# Patient Record
Sex: Female | Born: 1940 | Race: White | Hispanic: No | Smoking: Former smoker
Health system: Southern US, Community
[De-identification: ages and names within clinical notes are randomized; demographics above are authoritative.]

## PROBLEM LIST (undated history)

## (undated) DIAGNOSIS — E669 Obesity, unspecified: Secondary | ICD-10-CM

## (undated) DIAGNOSIS — E785 Hyperlipidemia, unspecified: Secondary | ICD-10-CM

## (undated) DIAGNOSIS — E119 Type 2 diabetes mellitus without complications: Secondary | ICD-10-CM

## (undated) DIAGNOSIS — M1990 Unspecified osteoarthritis, unspecified site: Secondary | ICD-10-CM

## (undated) DIAGNOSIS — G4730 Sleep apnea, unspecified: Secondary | ICD-10-CM

## (undated) HISTORY — PX: TONSILLECTOMY: SUR1361

## (undated) HISTORY — PX: ABDOMINAL HYSTERECTOMY: SHX81

## (undated) HISTORY — DX: Unspecified osteoarthritis, unspecified site: M19.90

## (undated) HISTORY — DX: Hyperlipidemia, unspecified: E78.5

## (undated) HISTORY — DX: Sleep apnea, unspecified: G47.30

## (undated) HISTORY — DX: Obesity, unspecified: E66.9

---

## 2000-08-11 ENCOUNTER — Encounter: Payer: Self-pay | Admitting: Family Medicine

## 2000-08-11 ENCOUNTER — Encounter: Admission: RE | Admit: 2000-08-11 | Discharge: 2000-08-11 | Payer: Self-pay | Admitting: Family Medicine

## 2001-11-21 ENCOUNTER — Encounter: Payer: Self-pay | Admitting: Family Medicine

## 2001-11-21 ENCOUNTER — Encounter: Admission: RE | Admit: 2001-11-21 | Discharge: 2001-11-21 | Payer: Self-pay | Admitting: Family Medicine

## 2004-02-26 ENCOUNTER — Encounter: Admission: RE | Admit: 2004-02-26 | Discharge: 2004-02-26 | Payer: Self-pay | Admitting: Family Medicine

## 2004-03-21 ENCOUNTER — Other Ambulatory Visit: Admission: RE | Admit: 2004-03-21 | Discharge: 2004-03-21 | Payer: Self-pay | Admitting: Family Medicine

## 2004-06-12 ENCOUNTER — Ambulatory Visit (HOSPITAL_COMMUNITY): Admission: RE | Admit: 2004-06-12 | Discharge: 2004-06-12 | Payer: Self-pay | Admitting: Gastroenterology

## 2004-09-30 ENCOUNTER — Encounter: Admission: RE | Admit: 2004-09-30 | Discharge: 2004-09-30 | Payer: Self-pay | Admitting: Family Medicine

## 2006-01-04 ENCOUNTER — Encounter: Admission: RE | Admit: 2006-01-04 | Discharge: 2006-01-04 | Payer: Self-pay | Admitting: Family Medicine

## 2006-03-02 ENCOUNTER — Other Ambulatory Visit: Admission: RE | Admit: 2006-03-02 | Discharge: 2006-03-02 | Payer: Self-pay | Admitting: Family Medicine

## 2006-03-03 ENCOUNTER — Encounter: Admission: RE | Admit: 2006-03-03 | Discharge: 2006-03-03 | Payer: Self-pay | Admitting: Family Medicine

## 2007-03-28 ENCOUNTER — Encounter: Admission: RE | Admit: 2007-03-28 | Discharge: 2007-03-28 | Payer: Self-pay | Admitting: Family Medicine

## 2007-04-05 ENCOUNTER — Other Ambulatory Visit: Admission: RE | Admit: 2007-04-05 | Discharge: 2007-04-05 | Payer: Self-pay | Admitting: Family Medicine

## 2008-05-15 ENCOUNTER — Encounter: Admission: RE | Admit: 2008-05-15 | Discharge: 2008-05-15 | Payer: Self-pay | Admitting: Family Medicine

## 2009-05-09 ENCOUNTER — Encounter: Admission: RE | Admit: 2009-05-09 | Discharge: 2009-05-09 | Payer: Self-pay | Admitting: Family Medicine

## 2009-06-06 ENCOUNTER — Encounter: Admission: RE | Admit: 2009-06-06 | Discharge: 2009-06-06 | Payer: Self-pay | Admitting: Family Medicine

## 2010-06-09 ENCOUNTER — Encounter
Admission: RE | Admit: 2010-06-09 | Discharge: 2010-06-09 | Payer: Self-pay | Source: Home / Self Care | Attending: Family Medicine | Admitting: Family Medicine

## 2010-08-05 ENCOUNTER — Inpatient Hospital Stay (HOSPITAL_COMMUNITY)
Admission: EM | Admit: 2010-08-05 | Discharge: 2010-08-07 | DRG: 392 | Disposition: A | Discharge status: Home / Self Care | Payer: Medicare Other | Attending: Internal Medicine | Admitting: Internal Medicine

## 2010-08-05 ENCOUNTER — Emergency Department (HOSPITAL_COMMUNITY): Payer: Medicare Other

## 2010-08-05 ENCOUNTER — Encounter (HOSPITAL_COMMUNITY): Payer: Self-pay

## 2010-08-05 DIAGNOSIS — E785 Hyperlipidemia, unspecified: Secondary | ICD-10-CM | POA: Diagnosis present

## 2010-08-05 DIAGNOSIS — K7689 Other specified diseases of liver: Secondary | ICD-10-CM | POA: Diagnosis present

## 2010-08-05 DIAGNOSIS — K5900 Constipation, unspecified: Secondary | ICD-10-CM | POA: Diagnosis present

## 2010-08-05 DIAGNOSIS — K5732 Diverticulitis of large intestine without perforation or abscess without bleeding: Principal | ICD-10-CM | POA: Diagnosis present

## 2010-08-05 LAB — CBC
MCV: 85.5 fL (ref 78.0–100.0)
Platelets: 293 10*3/uL (ref 150–400)

## 2010-08-05 LAB — URINALYSIS, ROUTINE W REFLEX MICROSCOPIC
Bilirubin Urine: NEGATIVE
Hgb urine dipstick: NEGATIVE
Ketones, ur: NEGATIVE mg/dL
Nitrite: NEGATIVE
Protein, ur: NEGATIVE mg/dL
Urine Glucose, Fasting: NEGATIVE mg/dL
Urobilinogen, UA: 1 mg/dL (ref 0.0–1.0)
pH: 7 (ref 5.0–8.0)

## 2010-08-05 LAB — POCT I-STAT, CHEM 8
Chloride: 105 mEq/L (ref 96–112)
Glucose, Bld: 150 mg/dL — ABNORMAL HIGH (ref 70–99)
Potassium: 4 mEq/L (ref 3.5–5.1)
Sodium: 136 mEq/L (ref 135–145)
TCO2: 22 mmol/L (ref 0–100)

## 2010-08-05 LAB — URINE MICROSCOPIC-ADD ON

## 2010-08-05 LAB — OCCULT BLOOD, POC DEVICE: Fecal Occult Bld: NEGATIVE

## 2010-08-05 MED ORDER — IOHEXOL 300 MG/ML  SOLN
80.0000 mL | Freq: Once | INTRAMUSCULAR | Status: AC | PRN
  Administered 2010-08-05: 80 mL via INTRAVENOUS

## 2010-08-07 LAB — CBC
Hemoglobin: 13 g/dL (ref 12.0–15.0)
MCHC: 34.1 g/dL (ref 30.0–36.0)
RBC: 4.4 MIL/uL (ref 3.87–5.11)
RDW: 12.4 % (ref 11.5–15.5)

## 2010-08-07 LAB — DIFFERENTIAL
Basophils Absolute: 0 10*3/uL (ref 0.0–0.1)
Basophils Relative: 1 % (ref 0–1)
Eosinophils Absolute: 0.1 10*3/uL (ref 0.0–0.7)
Neutro Abs: 2.9 10*3/uL (ref 1.7–7.7)
Neutrophils Relative %: 61 % (ref 43–77)

## 2010-08-11 NOTE — Discharge Summary (Signed)
Brianna [name], Brianna [name]             ACCOUNT NO.:  [id number]  MEDICAL RECORD NO.:  [id number]           PATIENT TYPE:  I  LOCATION:  5529                         FACILITY:  [facility]  PHYSICIAN:  Jeoffrey Massed, MD    DATE OF BIRTH:  05-Feb-1941  DATE OF ADMISSION:  08/05/2010 DATE OF DISCHARGE:                        DISCHARGE SUMMARY - REFERRING   PRIMARY CARE PRACTITIONER:  [name], M.D.  PRIMARY GASTROENTEROLOGIST:  Dr. Randa Evens of [facility].  PRIMARY DISCHARGE DIAGNOSIS:  Acute diverticulitis.  SECONDARY DISCHARGE DIAGNOSES: 1. Dyslipidemia. 2. Intermittent constipation.  DISCHARGE MEDICATIONS: 1. Tylenol 325 mg 2  tablets p.o. q.4 p.r.n. 2. Ciprofloxacin 500 mg 1 tablet p.o. twice daily for 7 more days. 3. Vicodin 5/325 one to two tablets every 6 hours p.r.n. 4. Flagyl 500 mg 1 tablet p.o. q.8 hours. 5. Zofran 4 mg 1 tablet p.o. q.6 h p.r.n. 6. Simvastatin 80 mg 1 tablet p.o. daily.  CONSULTATIONS:  None.  BRIEF HISTORY OF PRESENT ILLNESS:  The patient is a very delightful 70- year-old female with a past medical history of dyslipidemia was brought into the hospital on the August 05, 2010 for a lower abdominal pain, which was going on and off for the past 1 to 1-1/2 weeks.  However on the day of admission, the pain became severe with some nausea and was then brought into the ED for further evaluation.  A CT scan of the abdomen done showed acute diverticulitis involving the sigmoid colon and the patient was then admitted to the hospital for further evaluation and treatment.  For further details please see the history and physical that was dictated by Dr. Butler Denmark on admission.  PERTINENT LABORATORY STUDIES:  CBC on discharge, WBC of 4.8, hemoglobin of 13.0, and a platelet count of 329.  PERTINENT RADIOLOGICAL STUDIES:  CT scan of the abdomen and pelvis shows evidence of diverticulitis involving the sigmoid colon.  No focal abscess or free air is  identified.  BRIEF HOSPITAL COURSE:  Acute diverticulitis.  This is the first episode for this patient.  As noted, she was brought in for abdominal pain and a CT scan did confirm sigmoid diverticulitis.  The patient was initially kept n.p.o. and then started on a full liquid diet.  She was also placed on ciprofloxacin and Flagyl IV.  Over the past 48 hours, she has done very well.  She has tolerated a full liquid diet the whole day yesterday and then was transitioned to a soft/low residual diet today.  She is tolerating this as well.  She does get intermittent pain, but that is relieved by Vicodin.  Her last dose of Vicodin today was earlier this morning and for the rest of the day, she has had no further requirements for Vicodin since she has had no pain.  She did tolerate her lunch as well and is now requesting to be discharged home.  I have suggested that she follow up with her primary care practitioner, Dr. Clelia Croft in the next 5- 7 days and she consult her primary gastroenterologist in the next 2 to 3 week's time for further ongoing care of this first episode of diverticulitis.  She is to continue taking Flagyl and ciprofloxacin for another week.  She has also been provided with Zofran on a p.r.n. basis and also with Vicodin to use on a p.r.n. basis for the next few days. On examination, she has very minimal tenderness in her lower abdominal area with no rebound or guarding at all.  Rest of the abdomen was very soft and nontender.  CBC done today shows a white count to be 4.8 of a hemoglobin of 13.0 and a platelet count of 329.  At this point, she is considered stable to be discharged home.  DISPOSITION:  The patient was considered stable to be discharged home. She is currently pain free and tolerating a low residual diet.  FOLLOWUP INSTRUCTIONS: 1. She is to follow up with Dr. [name], her primary care     practitioner within 5-7 days.  She is to call and make an      appointment. 2. She is to follow up with Dr. Randa Evens of [facility] in the 1 to 2     weeks for further ongoing care of her diverticulitis and for     further workup including a colonoscopy as well in time. 3. She is to call and make both of these appointment.  She claims     understanding.  Total time spent equals 45 minutes.     Jeoffrey Massed, MD     SG/MEDQ  D:  08/07/2010  T:  08/07/2010  Job:  [id number]  cc:   [name], M.D. James L. Malon Kindle., M.D.  Electronically Signed by Jeoffrey Massed  on 08/11/2010 04:30:09 PM

## 2010-08-19 NOTE — H&P (Signed)
NAME:  Brianna [name], Brianna [name]             ACCOUNT NO.:  [id number]  MEDICAL RECORD NO.:  [id number]           PATIENT TYPE:  E  LOCATION:  [facility]                         FACILITY:  [facility]  PHYSICIAN:  Calvert Cantor, M.D.     DATE OF BIRTH:  February 02, 1941  DATE OF ADMISSION:  08/05/2010 DATE OF DISCHARGE:                             HISTORY & PHYSICAL   PRIMARY CARE PHYSICIAN:  [name], MD.  PRESENTING COMPLAINT:  Lower abdominal pain.  HISTORY OF PRESENT ILLNESS:  This is a 70 year old female with a history of hyperlipidemia, who comes in with a complaint of pain across her lower abdomen going on for about 1-1/2 weeks now.  She is not aware of any aggravating or relieving factors.  She has been eating like normal. Has not had any nausea, vomiting, or diarrhea.  She describes the pain as dull and intermittently cramping.  She is found to have diverticulitis of her sigmoid colon and is being admitted.  She states that pain was 10/10 this morning and therefore she decided to come to the ER.  PAST MEDICAL HISTORY:  Hyperlipidemia.  SOCIAL HISTORY:  Does not smoke, drink, or use drugs.  She is retired. She lives with her husband.  ALLERGIES:  No known drug allergies.  HOME MEDICATIONS:  Simvastatin 80 mg at bedtime.  REVIEW OF SYSTEMS:  No recent weight loss or weight gain.  No fever, chills, or sweats.  She has had some fatigue.  No nausea, vomiting, or diarrhea.  All the review of systems are negative.  PHYSICAL EXAM:  VITAL SIGNS:  Blood pressure 110/58, pulse 77, respiratory rate 20, temperature 98.7, pulse ox 97% on room air. HEENT:  Pupils equal, round, and reactive to light.  Extraocular movements are intact.  Conjunctiva is pink.  No scleral icterus.  Oral mucosa is moist.  Oropharynx clear. NECK:  Supple.  No thyromegaly, lymphadenopathy, or carotid bruits. HEART:  Regular rate and rhythm.  No murmurs, rubs, or gallops. LUNGS:  Clear bilaterally.  Normal respiratory  effort.  No use of accessory muscles. ABDOMEN:  Soft.  Tender in the left lower quadrant suprapubic area. Less tender in the right lower quadrant.  Bowel sounds are positive, nondistended. EXTREMITIES:  No cyanosis, clubbing, or edema.  Pedal pulses positive. NEUROLOGIC:  Cranial nerves II-XII intact.  Strength intact in all 4 extremities. PSYCHOLOGIC:  Awake, alert, oriented x3.  Mood and affect normal. SKIN:  Warm and dry.  No rash or bruising.  LABORATORY DATA:  Blood work, basic metabolic panel and CBC are within normal limits.  UA reveals 0-2 wbc's, few bacteria, and a small amount of leukocyte esterase.  Specific gravity 1.021.  IMAGING:  CT scan of the abdomen reveals evidence of diverticulitis involving the sigmoid colon.  The patient also has a fatty liver.  Acute abdominal series with chest x-ray reveals minimal chronic bronchitic changes and mildly prominent stool in the abdomen.  She has minimal scoliosis as well.  ASSESSMENT AND PLAN: 1. Diverticulitis, mainly being admitted as her pain is uncontrolled,  otherwise she could take p.o. antibiotics.  We will start her on IV  Cipro and Flagyl and hopefully she will be able to be discharged     tomorrow if her pain is better controlled.  For now, I will put her     on full liquids, which should hopefully help decrease inflammation     in her colon.  Diet can be advanced tomorrow. 2. Fatty liver as noted on ultrasound. 3. Constipation. 4. Hyperlipidemia.  Time on admission 50 minutes.     Calvert Cantor, M.D.     SR/MEDQ  D:  08/05/2010  T:  08/05/2010  Job:  [id number]  cc:   [name], M.D.  Electronically Signed by Calvert Cantor M.D. on 08/19/2010 08:20:58 PM

## 2010-11-07 NOTE — Op Note (Signed)
Brianna [name], Brianna [name]             ACCOUNT NO.:  [id number]   MEDICAL RECORD NO.:  [id number]          PATIENT TYPE:  AMB   LOCATION:  ENDO                         FACILITY:  [facility]   PHYSICIAN:  James L. Malon Kindle., M.D.DATE OF BIRTH:  14-Feb-1941   DATE OF PROCEDURE:  06/12/2004  DATE OF DISCHARGE:                                 OPERATIVE REPORT   PROCEDURE PERFORMED:  Colonoscopy.   ENDOSCOPIST:  Llana Aliment. Edwards, M.D.   MEDICATIONS:  Fentanyl 100 mcg, Versed 10 mg IV.   INDICATIONS FOR PROCEDURE:  Colon cancer screening.   DESCRIPTION OF PROCEDURE:  The procedure had been explained to the patient  and consent obtained.  With the patient in the left lateral decubitus  position, the pediatric adjustable Olympus colonoscope was inserted and  advanced.  The prep was excellent and we were able to advance to the cecum  without difficulty.  The ileocecal valve and appendiceal orifice were seen.  The scope was withdrawn and the cecum, ascending colon, transverse colon,  descending and sigmoid colon were seen well.  No polyps or other lesions  seen.  The rectum was free of polyps.  The scope was withdrawn.  The patient  tolerated the procedure well.   ASSESSMENT:  Normal screening colonoscopy, [id number].   PLAN:  Will recommend yearly Hemoccults, possibly repeat procedure in 10  years.       JLE/MEDQ  D:  06/12/2004  T:  06/13/2004  Job:  [id number]   cc:   Gretta Arab. Valentina Lucks, M.D.  [address] [place]  [place] [address]  Fax: [phone]

## 2011-05-06 ENCOUNTER — Other Ambulatory Visit: Payer: Self-pay | Admitting: Family Medicine

## 2011-05-06 DIAGNOSIS — Z1231 Encounter for screening mammogram for malignant neoplasm of breast: Secondary | ICD-10-CM

## 2011-06-11 ENCOUNTER — Ambulatory Visit
Admission: RE | Admit: 2011-06-11 | Discharge: 2011-06-11 | Disposition: A | Discharge status: Home / Self Care | Payer: Medicare Other | Source: Ambulatory Visit | Attending: Family Medicine | Admitting: Family Medicine

## 2011-06-11 DIAGNOSIS — Z1231 Encounter for screening mammogram for malignant neoplasm of breast: Secondary | ICD-10-CM

## 2012-05-10 ENCOUNTER — Other Ambulatory Visit: Payer: Self-pay | Admitting: Family Medicine

## 2012-05-10 DIAGNOSIS — Z1231 Encounter for screening mammogram for malignant neoplasm of breast: Secondary | ICD-10-CM

## 2012-06-17 ENCOUNTER — Ambulatory Visit: Payer: Medicare Other

## 2012-06-20 ENCOUNTER — Ambulatory Visit
Admission: RE | Admit: 2012-06-20 | Discharge: 2012-06-20 | Disposition: A | Discharge status: Home / Self Care | Payer: Medicare Other | Source: Ambulatory Visit | Attending: Family Medicine | Admitting: Family Medicine

## 2012-06-20 DIAGNOSIS — Z1231 Encounter for screening mammogram for malignant neoplasm of breast: Secondary | ICD-10-CM

## 2013-01-05 ENCOUNTER — Encounter: Payer: Self-pay | Admitting: Dietician

## 2013-01-05 ENCOUNTER — Encounter: Payer: Medicare Other | Attending: Family Medicine | Admitting: Dietician

## 2013-01-05 VITALS — Ht 63.0 in | Wt 146.4 lb

## 2013-01-05 DIAGNOSIS — E119 Type 2 diabetes mellitus without complications: Secondary | ICD-10-CM | POA: Insufficient documentation

## 2013-01-05 DIAGNOSIS — Z713 Dietary counseling and surveillance: Secondary | ICD-10-CM | POA: Insufficient documentation

## 2013-01-05 NOTE — Addendum Note (Signed)
Addended by: Delmer Islam on: 01/05/2013 02:05 PM   Modules accepted: Orders

## 2013-01-05 NOTE — Progress Notes (Signed)
  Patient was seen on 07/17/2014for the first of a series of three diabetes self-management courses at the [facility]. The following learning objectives were met by the patient during this course:   Defines the role of glucose and insulin  Identifies type of diabetes and pathophysiology  Defines the diagnostic criteria for diabetes and prediabetes  States the risk factors for Type 2 Diabetes  States the symptoms of Type 2 Diabetes  Defines Type 2 Diabetes treatment goals  Defines Type 2 Diabetes treatment options  States the rationale for glucose monitoring  Identifies A1C, glucose targets, and testing times  Identifies proper sharps disposal  Defines the purpose of a diabetes food plan  Identifies carbohydrate food groups  Defines effects of carbohydrate foods on glucose levels  Identifies carbohydrate choices/grams/food labels  States benefits of physical activity and effect on glucose  Review of suggested activity guidelines  Handouts given during class include:  Type 2 Diabetes: Basics Book  My Food Plan Book  Food and Activity Log  Follow-Up Plan: Attend Core 2  

## 2013-01-24 ENCOUNTER — Encounter: Payer: Medicare Other | Attending: Family Medicine

## 2013-01-24 DIAGNOSIS — E119 Type 2 diabetes mellitus without complications: Secondary | ICD-10-CM | POA: Insufficient documentation

## 2013-01-24 DIAGNOSIS — Z713 Dietary counseling and surveillance: Secondary | ICD-10-CM | POA: Insufficient documentation

## 2013-01-25 NOTE — Progress Notes (Signed)
Patient was seen on 01/24/13 for the second of a series of three diabetes self-management courses at the [facility]. The following learning objectives were met by the patient during this course:   Explain basic nutrition maintenance and quality assurance  Describe causes, symptoms and treatment of hypoglycemia and hyperglycemia  Explain how to manage diabetes during illness  Describe the importance of good nutrition for health and healthy eating strategies  List strategies to follow meal plan when dining out  Describe the effects of alcohol on glucose and how to use it safely  Describe problem solving skills for day-to-day glucose challenges  Describe strategies to use when treatment plan needs to change  Identify important factors involved in successful weight loss  Describe ways to remain physically active  Describe the impact of regular activity on insulin resistance  Identify current diabetes medications, their action on blood glucose, and [pssible side effects.  Handouts given in class:  Refrigerator magnet for Sick Day Guidelines  [facility] Oral medication/insulin handout  Your patient has identified their diabetes self-care support plan as:  [facility] support group   Follow-Up Plan: Patient will attend the final class of the ADA Diabetes Self-Care Education.   

## 2013-02-23 ENCOUNTER — Encounter: Payer: Medicare Other | Attending: Family Medicine

## 2013-02-23 DIAGNOSIS — Z713 Dietary counseling and surveillance: Secondary | ICD-10-CM | POA: Insufficient documentation

## 2013-02-23 DIAGNOSIS — E119 Type 2 diabetes mellitus without complications: Secondary | ICD-10-CM

## 2013-02-23 NOTE — Progress Notes (Signed)
Patient was seen on 02/23/13 for the third of a series of three diabetes self-management courses at the [facility]. The following learning objectives were met by the patient during this course:    Describe how diabetes changes over time   Identify diabetes complications and ways to prevent them   Describe strategies that can promote heart health including lowering blood pressure and cholesterol   Describe strategies to lower dietary fat and sodium in the diet   Identify physical activities that benefit cardiovascular health   Describe role of stress on blood glucose and develop strategies to address psychosocial issues   Evaluate success in meeting personal goal   Describe the belief that they can live successfully with diabetes day to day   Establish 2-3 goals that they will plan to diligently work on until they return for the free 14-month follow-up visit  The following handouts were given in class:  Goal setting handout  Class evaluation form  Low-sodium seasoning tips  Stress management handout  Your patient has established the following 4 month goals for diabetes self-care:  Eat more vegetables  Be active 30 minutes 3 days a week  Your patient has identified these potential barriers to change:  spouse  Your patient has identified their diabetes self-care support plan as:  [facility] support group  spouse   Follow-Up Plan: Patient was offered a 4 month follow-up visit for diabetes self-management education.

## 2013-05-24 ENCOUNTER — Other Ambulatory Visit: Payer: Self-pay

## 2013-05-24 DIAGNOSIS — Z1231 Encounter for screening mammogram for malignant neoplasm of breast: Secondary | ICD-10-CM

## 2013-05-29 ENCOUNTER — Ambulatory Visit
Admission: RE | Admit: 2013-05-29 | Discharge: 2013-05-29 | Disposition: A | Discharge status: Home / Self Care | Payer: Medicare Other | Source: Ambulatory Visit | Attending: Family Medicine | Admitting: Family Medicine

## 2013-05-29 ENCOUNTER — Other Ambulatory Visit: Payer: Self-pay | Admitting: Family Medicine

## 2013-05-29 DIAGNOSIS — M542 Cervicalgia: Secondary | ICD-10-CM

## 2013-06-08 ENCOUNTER — Other Ambulatory Visit: Payer: Self-pay | Admitting: Family Medicine

## 2013-06-08 DIAGNOSIS — M542 Cervicalgia: Secondary | ICD-10-CM

## 2013-06-13 ENCOUNTER — Ambulatory Visit
Admission: RE | Admit: 2013-06-13 | Discharge: 2013-06-13 | Disposition: A | Discharge status: Home / Self Care | Payer: Medicare Other | Source: Ambulatory Visit | Attending: Family Medicine | Admitting: Family Medicine

## 2013-06-13 DIAGNOSIS — M542 Cervicalgia: Secondary | ICD-10-CM

## 2013-06-27 ENCOUNTER — Encounter: Payer: Self-pay | Admitting: *Deleted

## 2013-06-27 ENCOUNTER — Encounter: Payer: Medicare Other | Attending: Family Medicine | Admitting: *Deleted

## 2013-06-27 VITALS — Ht 63.0 in | Wt 137.7 lb

## 2013-06-27 DIAGNOSIS — E119 Type 2 diabetes mellitus without complications: Secondary | ICD-10-CM

## 2013-06-27 DIAGNOSIS — Z713 Dietary counseling and surveillance: Secondary | ICD-10-CM | POA: Insufficient documentation

## 2013-06-27 NOTE — Patient Instructions (Signed)
PLAN: Find exercise alternative for inclement weather Have 1 carb choice/protein snack in evening Test glucose FBS and 2hours after meal alternating days Anticipate elevation of glucose reading with the steroid injections in the neck.

## 2013-06-27 NOTE — Progress Notes (Signed)
  Patient was seen on 06/27/13 for their 3 month follow-up as a part of the diabetes self-management courses at the [facility].   Patient self reports the following: Weight loss 14# since diagnosis of T2DM 12/2012. FBS range 130-135 mg/dl, pp range 116-120. Last PM 2072hpp. Last PM reading was alarming to her. She is experiencing some sever pain in her neck due to arthritis. She will be receiving steroid injections in the spine in the near future.  Diabetes control has improved since diabetes self-management training: does well regularly, had a struggle during the holidays with portion control. Number of days blood glucose is >200: none Last MD appointment for diabetes: 04/2013 with current A1c 6.2% which is down from 7.0% Changes in treatment plan: Find an exercise program that you can do inside during inclement weather. Walking CD's, low impact exercise CD's. Consider having a 1 carb choice snack/protein prior to bed to see if FBS comes down due to [place]. Confidence with ability to manage diabetes: high Areas for improvement with diabetes self-care: self control during group events. During holidays just ate too much of the "good" food. Not too much with the sweets. Willingness to participate in diabetes support group: not at this time  PLAN: Find exercise alternative for inclement weather Have 1 carb choice/protein snack in evening Test glucose FBS and 2hours after meal alternating days Anticipate elevation of glucose reading with the steroid injections in the neck.  Follow-Up Plan: Patient to call and schedule as needed.

## 2013-06-28 ENCOUNTER — Ambulatory Visit
Admission: RE | Admit: 2013-06-28 | Discharge: 2013-06-28 | Disposition: A | Discharge status: Home / Self Care | Payer: Medicare Other | Source: Ambulatory Visit

## 2013-06-28 DIAGNOSIS — Z1231 Encounter for screening mammogram for malignant neoplasm of breast: Secondary | ICD-10-CM

## 2013-08-03 ENCOUNTER — Emergency Department (HOSPITAL_COMMUNITY): Payer: Medicare Other

## 2013-08-03 ENCOUNTER — Encounter (HOSPITAL_COMMUNITY): Payer: Self-pay | Admitting: Emergency Medicine

## 2013-08-03 ENCOUNTER — Emergency Department (HOSPITAL_COMMUNITY)
Admission: EM | Admit: 2013-08-03 | Discharge: 2013-08-03 | Disposition: A | Discharge status: Home / Self Care | Payer: Medicare Other | Attending: Emergency Medicine | Admitting: Emergency Medicine

## 2013-08-03 DIAGNOSIS — G4730 Sleep apnea, unspecified: Secondary | ICD-10-CM | POA: Insufficient documentation

## 2013-08-03 DIAGNOSIS — Z7982 Long term (current) use of aspirin: Secondary | ICD-10-CM | POA: Insufficient documentation

## 2013-08-03 DIAGNOSIS — E785 Hyperlipidemia, unspecified: Secondary | ICD-10-CM | POA: Insufficient documentation

## 2013-08-03 DIAGNOSIS — R1110 Vomiting, unspecified: Secondary | ICD-10-CM

## 2013-08-03 DIAGNOSIS — Z79899 Other long term (current) drug therapy: Secondary | ICD-10-CM | POA: Insufficient documentation

## 2013-08-03 DIAGNOSIS — R109 Unspecified abdominal pain: Secondary | ICD-10-CM

## 2013-08-03 DIAGNOSIS — R112 Nausea with vomiting, unspecified: Secondary | ICD-10-CM | POA: Insufficient documentation

## 2013-08-03 DIAGNOSIS — M47812 Spondylosis without myelopathy or radiculopathy, cervical region: Secondary | ICD-10-CM | POA: Insufficient documentation

## 2013-08-03 DIAGNOSIS — R1011 Right upper quadrant pain: Secondary | ICD-10-CM | POA: Insufficient documentation

## 2013-08-03 DIAGNOSIS — E669 Obesity, unspecified: Secondary | ICD-10-CM | POA: Insufficient documentation

## 2013-08-03 DIAGNOSIS — Z90710 Acquired absence of both cervix and uterus: Secondary | ICD-10-CM | POA: Insufficient documentation

## 2013-08-03 DIAGNOSIS — Z791 Long term (current) use of non-steroidal anti-inflammatories (NSAID): Secondary | ICD-10-CM | POA: Insufficient documentation

## 2013-08-03 LAB — CBC WITH DIFFERENTIAL/PLATELET
BASOS PCT: 1 % (ref 0–1)
Basophils Absolute: 0 10*3/uL (ref 0.0–0.1)
Eosinophils Absolute: 0.1 10*3/uL (ref 0.0–0.7)
Eosinophils Relative: 3 % (ref 0–5)
HCT: 38.3 % (ref 36.0–46.0)
HEMOGLOBIN: 13.3 g/dL (ref 12.0–15.0)
Lymphocytes Relative: 38 % (ref 12–46)
Lymphs Abs: 2 10*3/uL (ref 0.7–4.0)
MCH: 29.3 pg (ref 26.0–34.0)
MCHC: 34.7 g/dL (ref 30.0–36.0)
MCV: 84.4 fL (ref 78.0–100.0)
MONOS PCT: 7 % (ref 3–12)
Monocytes Absolute: 0.4 10*3/uL (ref 0.1–1.0)
NEUTROS ABS: 2.7 10*3/uL (ref 1.7–7.7)
NEUTROS PCT: 52 % (ref 43–77)
Platelets: 239 10*3/uL (ref 150–400)
RBC: 4.54 MIL/uL (ref 3.87–5.11)
RDW: 12.5 % (ref 11.5–15.5)
WBC: 5.3 10*3/uL (ref 4.0–10.5)

## 2013-08-03 LAB — URINALYSIS, ROUTINE W REFLEX MICROSCOPIC
Bilirubin Urine: NEGATIVE
Glucose, UA: NEGATIVE mg/dL
HGB URINE DIPSTICK: NEGATIVE
Ketones, ur: NEGATIVE mg/dL
NITRITE: NEGATIVE
PH: 8 (ref 5.0–8.0)
Protein, ur: NEGATIVE mg/dL
Specific Gravity, Urine: 1.02 (ref 1.005–1.030)
Urobilinogen, UA: 0.2 mg/dL (ref 0.0–1.0)

## 2013-08-03 LAB — COMPREHENSIVE METABOLIC PANEL
ALBUMIN: 4 g/dL (ref 3.5–5.2)
ALK PHOS: 57 U/L (ref 39–117)
ALT: 20 U/L (ref 0–35)
AST: 21 U/L (ref 0–37)
BUN: 20 mg/dL (ref 6–23)
CHLORIDE: 103 meq/L (ref 96–112)
CO2: 22 mEq/L (ref 19–32)
Calcium: 9.8 mg/dL (ref 8.4–10.5)
Creatinine, Ser: 0.76 mg/dL (ref 0.50–1.10)
GFR calc Af Amer: >90 mL/min (ref >90)
GFR calc non Af Amer: 82 mL/min — ABNORMAL LOW (ref >90)
Glucose, Bld: 115 mg/dL — ABNORMAL HIGH (ref 70–99)
Potassium: 4.3 mEq/L (ref 3.7–5.3)
SODIUM: 141 meq/L (ref 137–147)
Total Bilirubin: 0.6 mg/dL (ref 0.3–1.2)
Total Protein: 6.9 g/dL (ref 6.0–8.3)

## 2013-08-03 LAB — CG4 I-STAT (LACTIC ACID)
Lactic Acid, Venous: 2.75 mmol/L — ABNORMAL HIGH (ref 0.5–2.2)
Lactic Acid, Venous: 1.7 mmol/L (ref 0.5–2.2)

## 2013-08-03 LAB — URINE MICROSCOPIC-ADD ON

## 2013-08-03 LAB — LIPASE, BLOOD: LIPASE: 42 U/L (ref 11–59)

## 2013-08-03 MED ORDER — PROMETHAZINE HCL 12.5 MG PO TABS: 12.5000 mg | ORAL_TABLET | Freq: Four times a day (QID) | ORAL | Status: DC | PRN

## 2013-08-03 MED ORDER — IOHEXOL 300 MG/ML  SOLN
50.0000 mL | Freq: Once | INTRAMUSCULAR | Status: AC | PRN
  Administered 2013-08-03: 50 mL via ORAL

## 2013-08-03 MED ORDER — ONDANSETRON HCL 4 MG/2ML IJ SOLN
4.0000 mg | Freq: Once | INTRAMUSCULAR | Status: AC
  Administered 2013-08-03: 4 mg via INTRAVENOUS
  Filled 2013-08-03: qty 2

## 2013-08-03 MED ORDER — PROMETHAZINE HCL 25 MG/ML IJ SOLN
12.5000 mg | Freq: Once | INTRAMUSCULAR | Status: AC
  Administered 2013-08-03: 12.5 mg via INTRAMUSCULAR
  Filled 2013-08-03 (×2): qty 1

## 2013-08-03 MED ORDER — SODIUM CHLORIDE 0.9 % IV BOLUS (SEPSIS)
1000.0000 mL | Freq: Once | INTRAVENOUS | Status: AC
  Administered 2013-08-03: 1000 mL via INTRAVENOUS

## 2013-08-03 MED ORDER — OXYCODONE-ACETAMINOPHEN 5-325 MG PO TABS: 1.0000 | ORAL_TABLET | ORAL | Status: DC | PRN

## 2013-08-03 MED ORDER — HYDROMORPHONE HCL PF 1 MG/ML IJ SOLN
1.0000 mg | Freq: Once | INTRAMUSCULAR | Status: AC
  Administered 2013-08-03: 1 mg via INTRAVENOUS
  Filled 2013-08-03: qty 1

## 2013-08-03 MED ORDER — HYDROMORPHONE HCL PF 1 MG/ML IJ SOLN
0.5000 mg | Freq: Once | INTRAMUSCULAR | Status: AC
  Administered 2013-08-03: 0.5 mg via INTRAVENOUS
  Filled 2013-08-03: qty 1

## 2013-08-03 NOTE — ED Notes (Signed)
Pt sent from MD office; c/o right upper quad pain since Monday off and on; sharp in nature radiating to right flank area; given toradol 60 mg at MD office without relief

## 2013-08-03 NOTE — ED Notes (Signed)
Lab to be drawn after liter of fluid has infused.  Wynetta Emery RN will inform.

## 2013-08-03 NOTE — ED Provider Notes (Signed)
4:10 PM Care transferred to me. Patient with colicky RUQ pain, benign labs and negative CT and abd u/s. Mild lactate elevation. Given fluids, will recheck lactate and if down trending will d/c home.  5:16 PM Lactate down to 1.7. Patient vomiting despite being given zofran for nausea. Will try phenergan IM and try to fluid challenge. If not improved will likely need admission.  7:00 PM Patient a little nauseous but tolerating fluids. Still with colicky pain. At this time her w/u is negative, will d/c with pain meds, nausea meds. I discussed strict return precautions, and she will f/u closely with PCP.  Ephraim Hamburger, MD 08/03/13 [phone]

## 2013-08-03 NOTE — Discharge Instructions (Signed)
Abdominal Pain, Adult °Many things can cause abdominal pain. Usually, abdominal pain is not caused by a disease and will improve without treatment. It can often be observed and treated at home. Your health care provider will do a physical exam and possibly order blood tests and X-rays to help determine the seriousness of your pain. However, in many cases, more time must pass before a clear cause of the pain can be found. Before that point, your health care provider may not know if you need more testing or further treatment. °HOME CARE INSTRUCTIONS  °Monitor your abdominal pain for any changes. The following actions may help to alleviate any discomfort you are experiencing: °· Only take over-the-counter or prescription medicines as directed by your health care provider. °· Do not take laxatives unless directed to do so by your health care provider. °· Try a clear liquid diet (broth, tea, or water) as directed by your health care provider. Slowly move to a bland diet as tolerated. °SEEK MEDICAL CARE IF: °· You have unexplained abdominal pain. °· You have abdominal pain associated with nausea or diarrhea. °· You have pain when you urinate or have a bowel movement. °· You experience abdominal pain that wakes you in the night. °· You have abdominal pain that is worsened or improved by eating food. °· You have abdominal pain that is worsened with eating fatty foods. °SEEK IMMEDIATE MEDICAL CARE IF:  °· Your pain does not go away within 2 hours. °· You have a fever. °· You keep throwing up (vomiting). °· Your pain is felt only in portions of the abdomen, such as the right side or the left lower portion of the abdomen. °· You pass bloody or black tarry stools. °MAKE SURE YOU: °· Understand these instructions.   °· Will watch your condition.   °· Will get help right away if you are not doing well or get worse.   °Document Released: 03/18/2005 Document Revised: 03/29/2013 Document Reviewed: 02/15/2013 °[employer]® Patient  Information ©2014 [employer]. ° °

## 2013-08-03 NOTE — ED Provider Notes (Signed)
CSN: [id number]     Arrival date & time 08/03/13  1113 History   First MD Initiated Contact with Patient 08/03/13 1141     Chief Complaint  Patient presents with  . Abdominal Pain     (Consider location/radiation/quality/duration/timing/severity/associated sxs/prior Treatment) Patient is a 73 y.o. female presenting with abdominal pain. The history is provided by the patient. No language interpreter was used.  Abdominal Pain Pain location:  RUQ Pain quality: sharp   Pain radiates to:  Back Pain severity:  Severe Onset quality:  Unable to specify Duration:  3 days Timing:  Intermittent Progression:  Waxing and waning Chronicity:  New Relieved by:  Nothing Worsened by:  Nothing tried Ineffective treatments:  None tried Associated symptoms: nausea   Associated symptoms: no chest pain, no chills, no constipation, no cough, no diarrhea, no dysuria, no fatigue, no fever, no hematuria, no shortness of breath, no sore throat and no vomiting   Risk factors: being elderly, multiple surgeries and NSAID use   Risk factors: no alcohol abuse, no aspirin use and no recent hospitalization     Past Medical History  Diagnosis Date  . Sleep apnea   . Obesity   . Hyperlipidemia   . Arthritis     neck   Past Surgical History  Procedure Laterality Date  . Tonsillectomy    . Abdominal hysterectomy     Family History  Problem Relation Age of Onset  . Cancer Other    History  Substance Use Topics  . Smoking status: Never Smoker   . Smokeless tobacco: Not on file  . Alcohol Use: Not on file   OB History   Grav Para Term Preterm Abortions TAB SAB Ect Mult Living                 Review of Systems  Constitutional: Negative for fever, chills, diaphoresis, activity change, appetite change and fatigue.  HENT: Negative for congestion, facial swelling, rhinorrhea and sore throat.   Eyes: Negative for photophobia and discharge.  Respiratory: Negative for cough, chest tightness and shortness  of breath.   Cardiovascular: Negative for chest pain, palpitations and leg swelling.  Gastrointestinal: Positive for nausea and abdominal pain. Negative for vomiting, diarrhea and constipation.  Endocrine: Negative for polydipsia and polyuria.  Genitourinary: Negative for dysuria, frequency, hematuria, difficulty urinating and pelvic pain.  Musculoskeletal: Negative for arthralgias, back pain, neck pain and neck stiffness.  Skin: Negative for color change and wound.  Allergic/Immunologic: Negative for immunocompromised state.  Neurological: Negative for facial asymmetry, weakness, numbness and headaches.  Hematological: Does not bruise/bleed easily.  Psychiatric/Behavioral: Negative for confusion and agitation.      Allergies  Review of patient's allergies indicates no known allergies.  Home Medications   Current Outpatient Rx  Name  Route  Sig  Dispense  Refill  . aspirin 81 MG tablet   Oral   Take 81 mg by mouth daily.         Marland Kitchen atorvastatin (LIPITOR) 20 MG tablet   Oral   Take 20 mg by mouth daily.         . calcium-vitamin D (OSCAL WITH D) 500-200 MG-UNIT per tablet   Oral   Take 1 tablet by mouth.         . cholecalciferol (VITAMIN D) 1000 UNITS tablet   Oral   Take 1,000 Units by mouth daily.          . Cinnamon 500 MG capsule   Oral   Take  1,000 mg by mouth daily.         Marland Kitchen FIBER PO   Oral   Take by mouth.         . fish oil-omega-3 fatty acids 1000 MG capsule   Oral   Take 1 g by mouth daily.          Marland Kitchen ibuprofen (ADVIL,MOTRIN) 200 MG tablet   Oral   Take 200-400 mg by mouth every 6 (six) hours as needed for headache.         . Multiple Vitamins-Minerals (HAIR/SKIN/NAILS PO)   Oral   Take 1 tablet by mouth daily.         . cyclobenzaprine (FLEXERIL) 5 MG tablet   Oral   Take 5 mg by mouth 3 (three) times daily as needed for muscle spasms.         . meloxicam (MOBIC) 7.5 MG tablet   Oral   Take 7.5 mg by mouth daily.          Marland Kitchen oxyCODONE-acetaminophen (PERCOCET) 5-325 MG per tablet   Oral   Take 1 tablet by mouth every 4 (four) hours as needed.   15 tablet   0   . promethazine (PHENERGAN) 12.5 MG tablet   Oral   Take 1 tablet (12.5 mg total) by mouth every 6 (six) hours as needed for nausea or vomiting.   10 tablet   0    BP 137/51  Pulse 61  Temp(Src) 97.5 F (36.4 C) (Oral)  Resp 14  Wt 135 lb (61.236 kg)  SpO2 97% Physical Exam  Constitutional: She is oriented to person, place, and time. She appears well-developed and well-nourished. No distress.  HENT:  Head: Normocephalic and atraumatic.  Mouth/Throat: No oropharyngeal exudate.  Eyes: Pupils are equal, round, and reactive to light.  Neck: Normal range of motion. Neck supple.  Cardiovascular: Normal rate, regular rhythm and normal heart sounds.  Exam reveals no gallop and no friction rub.   No murmur heard. Pulmonary/Chest: Effort normal and breath sounds normal. No respiratory distress. She has no wheezes. She has no rales.  Abdominal: Soft. Bowel sounds are normal. She exhibits no distension and no mass. There is tenderness in the right upper quadrant. There is positive Murphy's sign. There is no rebound and no guarding.  Musculoskeletal: Normal range of motion. She exhibits no edema and no tenderness.  Neurological: She is alert and oriented to person, place, and time.  Skin: Skin is warm and dry.  Psychiatric: She has a normal mood and affect.    ED Course  Procedures (including critical care time) Labs Review Labs Reviewed  COMPREHENSIVE METABOLIC PANEL - Abnormal; Notable for the following:    Glucose, Bld 115 (*)    GFR calc non Af Amer 82 (*)    All other components within normal limits  URINALYSIS, ROUTINE W REFLEX MICROSCOPIC - Abnormal; Notable for the following:    Leukocytes, UA TRACE (*)    All other components within normal limits  URINE MICROSCOPIC-ADD ON - Abnormal; Notable for the following:    Squamous Epithelial /  LPF FEW (*)    All other components within normal limits  CG4 I-STAT (LACTIC ACID) - Abnormal; Notable for the following:    Lactic Acid, Venous 2.75 (*)    All other components within normal limits  URINE CULTURE  CBC WITH DIFFERENTIAL  LIPASE, BLOOD  CG4 I-STAT (LACTIC ACID)   Imaging Review Ct Abdomen Pelvis Wo Contrast  08/03/2013  CLINICAL DATA:  Right upper quadrant pain sharp in nature radiating to right flank  EXAM: CT ABDOMEN AND PELVIS WITHOUT CONTRAST  TECHNIQUE: Multidetector CT imaging of the abdomen and pelvis was performed following the standard protocol without intravenous contrast.  COMPARISON:  08/05/2010  FINDINGS: Minimal atelectasis at lung bases.  Scattered atherosclerotic calcifications.  A single tiny calcification is seen at the right renal hilum, uncertain if within renal pelvis or vascular in origin.  No associated hydronephrosis.  No additional urinary tract calcification or ureteral dilatation.  Unremarkable bladder and ureters.  Uterus surgically absent with nonvisualization of the ovaries.  Within limits of a nonenhanced exam, no focal abnormalities of the liver, spleen, pancreas, kidneys, or adrenal glands.  Gallbladder unremarkable by CT.  Minimal sigmoid diverticulosis without evidence of diverticulitis.  Stomach and bowel loops otherwise unremarkable.  No mass, adenopathy, free fluid or inflammatory process.  No hernia or acute bone lesions.  IMPRESSION: Minimal sigmoid diverticulosis.  Tiny calcification at right renal hilum though uncertain if related to a vascular structure or a tiny renal pelvic calcification, favor vascular calcification.  No right hydronephrosis or ureteral dilatation identified.  Remainder of exam unremarkable.   Electronically Signed   By: Lavonia Dana M.D.   On: 08/03/2013 15:51   US Abdomen Complete  08/03/2013   CLINICAL DATA:  Right upper quadrant pain radiating to back  EXAM: ULTRASOUND ABDOMEN COMPLETE  COMPARISON:  Abdominal ultrasound  dated 01/11/2013  FINDINGS: Gallbladder:  Suspected 7 mm gallbladder polyp, nonmobile. No gallbladder wall thickening or pericholecystic fluid. Positive sonographic Murphy's sign.  Common bile duct:  Diameter: 3 mm.  Liver:  No focal lesion identified. Within normal limits in parenchymal echogenicity.  IVC:  No abnormality visualized.  Pancreas:  Reasons portions are unremarkable.  Spleen:  Measures 8.0 cm.  Right Kidney:  Length: 11.0 cm.  No mass or hydronephrosis.  Left Kidney:  Length: 10.8 cm.  No mass or hydronephrosis.  Abdominal aorta:  No aneurysm visualized.  Other findings:  None.  IMPRESSION: No evidence of acute cholecystitis, despite the presence of a positive sonographic Murphy's sign.  Suspected 7 mm gallbladder polyp. Follow-up ultrasound is suggested in 1 year.  This recommendation follows ACR consensus guidelines: White Paper of the ACR Incidental Findings Committee II on Gallbladder and Biliary Findings. J Am Coll Radiol 2013:;10:953-956.   Electronically Signed   By: Julian Hy M.D.   On: 08/03/2013 13:21    EKG Interpretation   None       MDM   Final diagnoses:  Abdominal pain  Vomiting    Pt is a 73 y.o. female with Pmhx as above who presents with sharp, intermittent RUQ pain w/ radiating to R back for 4 days.  On PE, VSS, pt in NAD.  +murphy's sign.  CBC, CMP, lipase, LA ,and RUQ [place] ordered.    2:36PM [place] w/ GB polyp, but no cholecystitis or cholelithiasis.  WBC nml, Hb nml.  CMP unremarkable. LA 2.75.  UA with trace leukocytes, but no blood.  Have ordered CT ab/pelvis.  IVF and repeat dilaudid given.   4:00 PM CT ab pelvis with tiny calcification in R renal hilum, minimal diverticulosis.  Otherwise unremarkable.  Given benign imaging studies, pain may be MSK in nature.  Doubt acute surgical cause of pain. Plan for repeat LA and d/c home by Dr. Regenia Skeeter if normalized.  She will need close outpt PCP f/u.        Neta Ehlers, MD 08/03/13 1946

## 2013-08-03 NOTE — ED Notes (Signed)
MD at bedside. 

## 2013-08-04 LAB — URINE CULTURE: Colony Count: 4000

## 2014-05-25 ENCOUNTER — Other Ambulatory Visit: Payer: Self-pay

## 2014-05-25 DIAGNOSIS — Z1231 Encounter for screening mammogram for malignant neoplasm of breast: Secondary | ICD-10-CM

## 2014-07-03 ENCOUNTER — Ambulatory Visit
Admission: RE | Admit: 2014-07-03 | Discharge: 2014-07-03 | Disposition: A | Discharge status: Home / Self Care | Payer: Medicare Other | Source: Ambulatory Visit

## 2014-07-03 DIAGNOSIS — Z1231 Encounter for screening mammogram for malignant neoplasm of breast: Secondary | ICD-10-CM

## 2015-05-27 ENCOUNTER — Other Ambulatory Visit: Payer: Self-pay

## 2015-05-27 DIAGNOSIS — Z1231 Encounter for screening mammogram for malignant neoplasm of breast: Secondary | ICD-10-CM

## 2015-07-08 ENCOUNTER — Ambulatory Visit
Admission: RE | Admit: 2015-07-08 | Discharge: 2015-07-08 | Disposition: A | Discharge status: Home / Self Care | Payer: Medicare Other | Source: Ambulatory Visit

## 2015-07-08 DIAGNOSIS — Z1231 Encounter for screening mammogram for malignant neoplasm of breast: Secondary | ICD-10-CM

## 2016-06-01 ENCOUNTER — Other Ambulatory Visit: Payer: Self-pay | Admitting: Family Medicine

## 2016-06-01 DIAGNOSIS — Z1231 Encounter for screening mammogram for malignant neoplasm of breast: Secondary | ICD-10-CM

## 2016-07-09 ENCOUNTER — Ambulatory Visit: Payer: Medicare Other

## 2016-07-10 DIAGNOSIS — E1165 Type 2 diabetes mellitus with hyperglycemia: Secondary | ICD-10-CM | POA: Diagnosis not present

## 2016-07-28 ENCOUNTER — Ambulatory Visit
Admission: RE | Admit: 2016-07-28 | Discharge: 2016-07-28 | Disposition: A | Discharge status: Home / Self Care | Payer: PPO | Source: Ambulatory Visit | Attending: Family Medicine | Admitting: Family Medicine

## 2016-07-28 DIAGNOSIS — Z1231 Encounter for screening mammogram for malignant neoplasm of breast: Secondary | ICD-10-CM

## 2016-10-26 DIAGNOSIS — K7581 Nonalcoholic steatohepatitis (NASH): Secondary | ICD-10-CM | POA: Diagnosis not present

## 2016-10-26 DIAGNOSIS — G250 Essential tremor: Secondary | ICD-10-CM | POA: Diagnosis not present

## 2016-10-26 DIAGNOSIS — M8580 Other specified disorders of bone density and structure, unspecified site: Secondary | ICD-10-CM | POA: Diagnosis not present

## 2016-10-26 DIAGNOSIS — E7800 Pure hypercholesterolemia, unspecified: Secondary | ICD-10-CM | POA: Diagnosis not present

## 2016-10-26 DIAGNOSIS — E119 Type 2 diabetes mellitus without complications: Secondary | ICD-10-CM | POA: Diagnosis not present

## 2017-03-15 DIAGNOSIS — Z23 Encounter for immunization: Secondary | ICD-10-CM | POA: Diagnosis not present

## 2017-03-31 DIAGNOSIS — C44729 Squamous cell carcinoma of skin of left lower limb, including hip: Secondary | ICD-10-CM | POA: Diagnosis not present

## 2017-04-05 DIAGNOSIS — C44729 Squamous cell carcinoma of skin of left lower limb, including hip: Secondary | ICD-10-CM | POA: Diagnosis not present

## 2017-04-05 DIAGNOSIS — L570 Actinic keratosis: Secondary | ICD-10-CM | POA: Diagnosis not present

## 2017-04-26 DIAGNOSIS — K7581 Nonalcoholic steatohepatitis (NASH): Secondary | ICD-10-CM | POA: Diagnosis not present

## 2017-04-26 DIAGNOSIS — G250 Essential tremor: Secondary | ICD-10-CM | POA: Diagnosis not present

## 2017-04-26 DIAGNOSIS — Z85828 Personal history of other malignant neoplasm of skin: Secondary | ICD-10-CM | POA: Diagnosis not present

## 2017-04-26 DIAGNOSIS — E7800 Pure hypercholesterolemia, unspecified: Secondary | ICD-10-CM | POA: Diagnosis not present

## 2017-04-26 DIAGNOSIS — Z0000 Encounter for general adult medical examination without abnormal findings: Secondary | ICD-10-CM | POA: Diagnosis not present

## 2017-04-26 DIAGNOSIS — N8110 Cystocele, unspecified: Secondary | ICD-10-CM | POA: Diagnosis not present

## 2017-04-26 DIAGNOSIS — M8580 Other specified disorders of bone density and structure, unspecified site: Secondary | ICD-10-CM | POA: Diagnosis not present

## 2017-04-26 DIAGNOSIS — Z136 Encounter for screening for cardiovascular disorders: Secondary | ICD-10-CM | POA: Diagnosis not present

## 2017-04-26 DIAGNOSIS — E119 Type 2 diabetes mellitus without complications: Secondary | ICD-10-CM | POA: Diagnosis not present

## 2017-06-18 ENCOUNTER — Other Ambulatory Visit: Payer: Self-pay | Admitting: Advanced Practice Midwife

## 2017-06-18 DIAGNOSIS — Z1231 Encounter for screening mammogram for malignant neoplasm of breast: Secondary | ICD-10-CM

## 2017-07-01 DIAGNOSIS — M8588 Other specified disorders of bone density and structure, other site: Secondary | ICD-10-CM | POA: Diagnosis not present

## 2017-07-29 ENCOUNTER — Ambulatory Visit
Admission: RE | Admit: 2017-07-29 | Discharge: 2017-07-29 | Disposition: A | Discharge status: Home / Self Care | Payer: PPO | Source: Ambulatory Visit | Attending: Advanced Practice Midwife | Admitting: Advanced Practice Midwife

## 2017-07-29 DIAGNOSIS — E1165 Type 2 diabetes mellitus with hyperglycemia: Secondary | ICD-10-CM | POA: Diagnosis not present

## 2017-07-29 DIAGNOSIS — Z1231 Encounter for screening mammogram for malignant neoplasm of breast: Secondary | ICD-10-CM

## 2017-10-27 DIAGNOSIS — K7581 Nonalcoholic steatohepatitis (NASH): Secondary | ICD-10-CM | POA: Diagnosis not present

## 2017-10-27 DIAGNOSIS — E1169 Type 2 diabetes mellitus with other specified complication: Secondary | ICD-10-CM | POA: Diagnosis not present

## 2017-10-27 DIAGNOSIS — E7800 Pure hypercholesterolemia, unspecified: Secondary | ICD-10-CM | POA: Diagnosis not present

## 2017-10-27 DIAGNOSIS — Z7984 Long term (current) use of oral hypoglycemic drugs: Secondary | ICD-10-CM | POA: Diagnosis not present

## 2017-12-01 DIAGNOSIS — N907 Vulvar cyst: Secondary | ICD-10-CM | POA: Diagnosis not present

## 2017-12-15 DIAGNOSIS — R109 Unspecified abdominal pain: Secondary | ICD-10-CM | POA: Diagnosis not present

## 2017-12-15 DIAGNOSIS — R300 Dysuria: Secondary | ICD-10-CM | POA: Diagnosis not present

## 2018-01-04 DIAGNOSIS — M25562 Pain in left knee: Secondary | ICD-10-CM | POA: Diagnosis not present

## 2018-01-04 DIAGNOSIS — M25512 Pain in left shoulder: Secondary | ICD-10-CM | POA: Diagnosis not present

## 2018-03-25 DIAGNOSIS — Z23 Encounter for immunization: Secondary | ICD-10-CM | POA: Diagnosis not present

## 2018-05-02 DIAGNOSIS — N907 Vulvar cyst: Secondary | ICD-10-CM | POA: Diagnosis not present

## 2018-05-02 DIAGNOSIS — Z7984 Long term (current) use of oral hypoglycemic drugs: Secondary | ICD-10-CM | POA: Diagnosis not present

## 2018-05-02 DIAGNOSIS — E1169 Type 2 diabetes mellitus with other specified complication: Secondary | ICD-10-CM | POA: Diagnosis not present

## 2018-05-16 DIAGNOSIS — Z85828 Personal history of other malignant neoplasm of skin: Secondary | ICD-10-CM | POA: Diagnosis not present

## 2018-05-16 DIAGNOSIS — Z0000 Encounter for general adult medical examination without abnormal findings: Secondary | ICD-10-CM | POA: Diagnosis not present

## 2018-05-16 DIAGNOSIS — K7581 Nonalcoholic steatohepatitis (NASH): Secondary | ICD-10-CM | POA: Diagnosis not present

## 2018-05-16 DIAGNOSIS — N8110 Cystocele, unspecified: Secondary | ICD-10-CM | POA: Diagnosis not present

## 2018-05-16 DIAGNOSIS — E7800 Pure hypercholesterolemia, unspecified: Secondary | ICD-10-CM | POA: Diagnosis not present

## 2018-05-16 DIAGNOSIS — M8580 Other specified disorders of bone density and structure, unspecified site: Secondary | ICD-10-CM | POA: Diagnosis not present

## 2018-05-16 DIAGNOSIS — Z7984 Long term (current) use of oral hypoglycemic drugs: Secondary | ICD-10-CM | POA: Diagnosis not present

## 2018-05-16 DIAGNOSIS — G250 Essential tremor: Secondary | ICD-10-CM | POA: Diagnosis not present

## 2018-05-16 DIAGNOSIS — E1169 Type 2 diabetes mellitus with other specified complication: Secondary | ICD-10-CM | POA: Diagnosis not present

## 2018-06-09 DIAGNOSIS — H25813 Combined forms of age-related cataract, bilateral: Secondary | ICD-10-CM | POA: Diagnosis not present

## 2018-06-09 DIAGNOSIS — E119 Type 2 diabetes mellitus without complications: Secondary | ICD-10-CM | POA: Diagnosis not present

## 2018-06-09 DIAGNOSIS — H524 Presbyopia: Secondary | ICD-10-CM | POA: Diagnosis not present

## 2018-06-09 DIAGNOSIS — H52223 Regular astigmatism, bilateral: Secondary | ICD-10-CM | POA: Diagnosis not present

## 2018-06-09 DIAGNOSIS — H5203 Hypermetropia, bilateral: Secondary | ICD-10-CM | POA: Diagnosis not present

## 2018-06-21 ENCOUNTER — Other Ambulatory Visit: Payer: Self-pay | Admitting: Family Medicine

## 2018-06-21 DIAGNOSIS — Z1231 Encounter for screening mammogram for malignant neoplasm of breast: Secondary | ICD-10-CM

## 2018-08-01 ENCOUNTER — Ambulatory Visit
Admission: RE | Admit: 2018-08-01 | Discharge: 2018-08-01 | Disposition: A | Discharge status: Home / Self Care | Payer: PPO | Source: Ambulatory Visit | Attending: Family Medicine | Admitting: Family Medicine

## 2018-08-01 DIAGNOSIS — Z1231 Encounter for screening mammogram for malignant neoplasm of breast: Secondary | ICD-10-CM

## 2018-11-10 DIAGNOSIS — E1169 Type 2 diabetes mellitus with other specified complication: Secondary | ICD-10-CM | POA: Diagnosis not present

## 2018-11-10 DIAGNOSIS — Z7984 Long term (current) use of oral hypoglycemic drugs: Secondary | ICD-10-CM | POA: Diagnosis not present

## 2018-11-10 DIAGNOSIS — E7800 Pure hypercholesterolemia, unspecified: Secondary | ICD-10-CM | POA: Diagnosis not present

## 2018-11-16 DIAGNOSIS — K7581 Nonalcoholic steatohepatitis (NASH): Secondary | ICD-10-CM | POA: Diagnosis not present

## 2018-11-16 DIAGNOSIS — E7800 Pure hypercholesterolemia, unspecified: Secondary | ICD-10-CM | POA: Diagnosis not present

## 2018-11-16 DIAGNOSIS — E1169 Type 2 diabetes mellitus with other specified complication: Secondary | ICD-10-CM | POA: Diagnosis not present

## 2019-01-03 DIAGNOSIS — C44719 Basal cell carcinoma of skin of left lower limb, including hip: Secondary | ICD-10-CM | POA: Diagnosis not present

## 2019-01-03 DIAGNOSIS — D485 Neoplasm of uncertain behavior of skin: Secondary | ICD-10-CM | POA: Diagnosis not present

## 2019-01-03 DIAGNOSIS — L821 Other seborrheic keratosis: Secondary | ICD-10-CM | POA: Diagnosis not present

## 2019-03-03 DIAGNOSIS — Z23 Encounter for immunization: Secondary | ICD-10-CM | POA: Diagnosis not present

## 2019-03-13 DIAGNOSIS — M25569 Pain in unspecified knee: Secondary | ICD-10-CM | POA: Diagnosis not present

## 2019-04-03 DIAGNOSIS — M1712 Unilateral primary osteoarthritis, left knee: Secondary | ICD-10-CM | POA: Diagnosis not present

## 2019-04-03 DIAGNOSIS — M25562 Pain in left knee: Secondary | ICD-10-CM | POA: Diagnosis not present

## 2019-04-24 DIAGNOSIS — M5442 Lumbago with sciatica, left side: Secondary | ICD-10-CM | POA: Diagnosis not present

## 2019-04-24 DIAGNOSIS — M25562 Pain in left knee: Secondary | ICD-10-CM | POA: Diagnosis not present

## 2019-04-24 DIAGNOSIS — M545 Low back pain: Secondary | ICD-10-CM | POA: Diagnosis not present

## 2019-05-31 DIAGNOSIS — E7800 Pure hypercholesterolemia, unspecified: Secondary | ICD-10-CM | POA: Diagnosis not present

## 2019-05-31 DIAGNOSIS — E1169 Type 2 diabetes mellitus with other specified complication: Secondary | ICD-10-CM | POA: Diagnosis not present

## 2019-05-31 DIAGNOSIS — Z7984 Long term (current) use of oral hypoglycemic drugs: Secondary | ICD-10-CM | POA: Diagnosis not present

## 2019-06-05 DIAGNOSIS — Z85828 Personal history of other malignant neoplasm of skin: Secondary | ICD-10-CM | POA: Diagnosis not present

## 2019-06-05 DIAGNOSIS — K7581 Nonalcoholic steatohepatitis (NASH): Secondary | ICD-10-CM | POA: Diagnosis not present

## 2019-06-05 DIAGNOSIS — Z0000 Encounter for general adult medical examination without abnormal findings: Secondary | ICD-10-CM | POA: Diagnosis not present

## 2019-06-05 DIAGNOSIS — E1169 Type 2 diabetes mellitus with other specified complication: Secondary | ICD-10-CM | POA: Diagnosis not present

## 2019-06-05 DIAGNOSIS — Z7984 Long term (current) use of oral hypoglycemic drugs: Secondary | ICD-10-CM | POA: Diagnosis not present

## 2019-06-05 DIAGNOSIS — Z7189 Other specified counseling: Secondary | ICD-10-CM | POA: Diagnosis not present

## 2019-06-05 DIAGNOSIS — E7800 Pure hypercholesterolemia, unspecified: Secondary | ICD-10-CM | POA: Diagnosis not present

## 2019-06-05 DIAGNOSIS — M8580 Other specified disorders of bone density and structure, unspecified site: Secondary | ICD-10-CM | POA: Diagnosis not present

## 2019-06-05 DIAGNOSIS — G250 Essential tremor: Secondary | ICD-10-CM | POA: Diagnosis not present

## 2019-06-05 DIAGNOSIS — N8110 Cystocele, unspecified: Secondary | ICD-10-CM | POA: Diagnosis not present

## 2019-06-05 DIAGNOSIS — F4321 Adjustment disorder with depressed mood: Secondary | ICD-10-CM | POA: Diagnosis not present

## 2019-06-14 DIAGNOSIS — H524 Presbyopia: Secondary | ICD-10-CM | POA: Diagnosis not present

## 2019-06-14 DIAGNOSIS — E119 Type 2 diabetes mellitus without complications: Secondary | ICD-10-CM | POA: Diagnosis not present

## 2019-06-18 DIAGNOSIS — U071 COVID-19: Secondary | ICD-10-CM | POA: Diagnosis not present

## 2019-06-18 DIAGNOSIS — R0981 Nasal congestion: Secondary | ICD-10-CM | POA: Diagnosis not present

## 2019-07-11 ENCOUNTER — Other Ambulatory Visit: Payer: Self-pay | Admitting: Family Medicine

## 2019-07-11 DIAGNOSIS — Z1231 Encounter for screening mammogram for malignant neoplasm of breast: Secondary | ICD-10-CM

## 2019-08-09 ENCOUNTER — Other Ambulatory Visit: Payer: Self-pay

## 2019-08-09 ENCOUNTER — Ambulatory Visit
Admission: RE | Admit: 2019-08-09 | Discharge: 2019-08-09 | Disposition: A | Discharge status: Home / Self Care | Payer: PPO | Source: Ambulatory Visit | Attending: Family Medicine | Admitting: Family Medicine

## 2019-08-09 DIAGNOSIS — Z1231 Encounter for screening mammogram for malignant neoplasm of breast: Secondary | ICD-10-CM | POA: Diagnosis not present

## 2019-09-07 DIAGNOSIS — E1169 Type 2 diabetes mellitus with other specified complication: Secondary | ICD-10-CM | POA: Diagnosis not present

## 2019-09-07 DIAGNOSIS — U071 COVID-19: Secondary | ICD-10-CM | POA: Diagnosis not present

## 2019-09-07 DIAGNOSIS — F4321 Adjustment disorder with depressed mood: Secondary | ICD-10-CM | POA: Diagnosis not present

## 2019-12-04 DIAGNOSIS — E1169 Type 2 diabetes mellitus with other specified complication: Secondary | ICD-10-CM | POA: Diagnosis not present

## 2019-12-04 DIAGNOSIS — E7800 Pure hypercholesterolemia, unspecified: Secondary | ICD-10-CM | POA: Diagnosis not present

## 2019-12-04 DIAGNOSIS — F4321 Adjustment disorder with depressed mood: Secondary | ICD-10-CM | POA: Diagnosis not present

## 2019-12-12 DIAGNOSIS — H524 Presbyopia: Secondary | ICD-10-CM | POA: Diagnosis not present

## 2019-12-12 DIAGNOSIS — H52223 Regular astigmatism, bilateral: Secondary | ICD-10-CM | POA: Diagnosis not present

## 2019-12-12 DIAGNOSIS — H25813 Combined forms of age-related cataract, bilateral: Secondary | ICD-10-CM | POA: Diagnosis not present

## 2019-12-12 DIAGNOSIS — H2510 Age-related nuclear cataract, unspecified eye: Secondary | ICD-10-CM | POA: Diagnosis not present

## 2019-12-12 DIAGNOSIS — H5203 Hypermetropia, bilateral: Secondary | ICD-10-CM | POA: Diagnosis not present

## 2020-01-18 DIAGNOSIS — H2512 Age-related nuclear cataract, left eye: Secondary | ICD-10-CM | POA: Diagnosis not present

## 2020-01-18 DIAGNOSIS — H25013 Cortical age-related cataract, bilateral: Secondary | ICD-10-CM | POA: Diagnosis not present

## 2020-01-18 DIAGNOSIS — H2513 Age-related nuclear cataract, bilateral: Secondary | ICD-10-CM | POA: Diagnosis not present

## 2020-01-18 DIAGNOSIS — H25043 Posterior subcapsular polar age-related cataract, bilateral: Secondary | ICD-10-CM | POA: Diagnosis not present

## 2020-01-18 DIAGNOSIS — H18413 Arcus senilis, bilateral: Secondary | ICD-10-CM | POA: Diagnosis not present

## 2020-01-31 DIAGNOSIS — H5202 Hypermetropia, left eye: Secondary | ICD-10-CM | POA: Diagnosis not present

## 2020-01-31 DIAGNOSIS — H2512 Age-related nuclear cataract, left eye: Secondary | ICD-10-CM | POA: Diagnosis not present

## 2020-01-31 DIAGNOSIS — H2510 Age-related nuclear cataract, unspecified eye: Secondary | ICD-10-CM | POA: Diagnosis not present

## 2020-01-31 DIAGNOSIS — H52222 Regular astigmatism, left eye: Secondary | ICD-10-CM | POA: Diagnosis not present

## 2020-01-31 DIAGNOSIS — Z961 Presence of intraocular lens: Secondary | ICD-10-CM | POA: Diagnosis not present

## 2020-02-01 DIAGNOSIS — H2511 Age-related nuclear cataract, right eye: Secondary | ICD-10-CM | POA: Diagnosis not present

## 2020-02-14 DIAGNOSIS — H2511 Age-related nuclear cataract, right eye: Secondary | ICD-10-CM | POA: Diagnosis not present

## 2020-02-14 DIAGNOSIS — Z961 Presence of intraocular lens: Secondary | ICD-10-CM | POA: Diagnosis not present

## 2020-03-16 DIAGNOSIS — Z23 Encounter for immunization: Secondary | ICD-10-CM | POA: Diagnosis not present

## 2020-06-24 DIAGNOSIS — G250 Essential tremor: Secondary | ICD-10-CM | POA: Diagnosis not present

## 2020-06-24 DIAGNOSIS — M8580 Other specified disorders of bone density and structure, unspecified site: Secondary | ICD-10-CM | POA: Diagnosis not present

## 2020-06-24 DIAGNOSIS — Z85828 Personal history of other malignant neoplasm of skin: Secondary | ICD-10-CM | POA: Diagnosis not present

## 2020-06-24 DIAGNOSIS — K7581 Nonalcoholic steatohepatitis (NASH): Secondary | ICD-10-CM | POA: Diagnosis not present

## 2020-06-24 DIAGNOSIS — E1169 Type 2 diabetes mellitus with other specified complication: Secondary | ICD-10-CM | POA: Diagnosis not present

## 2020-06-24 DIAGNOSIS — N8110 Cystocele, unspecified: Secondary | ICD-10-CM | POA: Diagnosis not present

## 2020-06-24 DIAGNOSIS — Z0000 Encounter for general adult medical examination without abnormal findings: Secondary | ICD-10-CM | POA: Diagnosis not present

## 2020-06-24 DIAGNOSIS — E7800 Pure hypercholesterolemia, unspecified: Secondary | ICD-10-CM | POA: Diagnosis not present

## 2020-07-02 ENCOUNTER — Other Ambulatory Visit: Payer: Self-pay | Admitting: Family Medicine

## 2020-07-02 DIAGNOSIS — Z1231 Encounter for screening mammogram for malignant neoplasm of breast: Secondary | ICD-10-CM

## 2020-07-02 DIAGNOSIS — M8580 Other specified disorders of bone density and structure, unspecified site: Secondary | ICD-10-CM

## 2020-08-13 ENCOUNTER — Ambulatory Visit
Admission: RE | Admit: 2020-08-13 | Discharge: 2020-08-13 | Disposition: A | Discharge status: Home / Self Care | Payer: PPO | Source: Ambulatory Visit | Attending: Family Medicine | Admitting: Family Medicine

## 2020-08-13 ENCOUNTER — Other Ambulatory Visit: Payer: Self-pay

## 2020-08-13 DIAGNOSIS — Z1231 Encounter for screening mammogram for malignant neoplasm of breast: Secondary | ICD-10-CM | POA: Diagnosis not present

## 2020-09-19 DIAGNOSIS — L858 Other specified epidermal thickening: Secondary | ICD-10-CM | POA: Diagnosis not present

## 2020-09-19 DIAGNOSIS — C44729 Squamous cell carcinoma of skin of left lower limb, including hip: Secondary | ICD-10-CM | POA: Diagnosis not present

## 2020-10-23 ENCOUNTER — Other Ambulatory Visit: Payer: Self-pay

## 2020-10-23 ENCOUNTER — Ambulatory Visit
Admission: RE | Admit: 2020-10-23 | Discharge: 2020-10-23 | Disposition: A | Discharge status: Home / Self Care | Payer: PPO | Source: Ambulatory Visit | Attending: Family Medicine | Admitting: Family Medicine

## 2020-10-23 DIAGNOSIS — Z780 Asymptomatic menopausal state: Secondary | ICD-10-CM | POA: Diagnosis not present

## 2020-10-23 DIAGNOSIS — M8580 Other specified disorders of bone density and structure, unspecified site: Secondary | ICD-10-CM

## 2020-10-23 DIAGNOSIS — M85851 Other specified disorders of bone density and structure, right thigh: Secondary | ICD-10-CM | POA: Diagnosis not present

## 2020-10-30 DIAGNOSIS — Z85828 Personal history of other malignant neoplasm of skin: Secondary | ICD-10-CM | POA: Diagnosis not present

## 2020-10-30 DIAGNOSIS — C44729 Squamous cell carcinoma of skin of left lower limb, including hip: Secondary | ICD-10-CM | POA: Diagnosis not present

## 2020-11-13 DIAGNOSIS — C44729 Squamous cell carcinoma of skin of left lower limb, including hip: Secondary | ICD-10-CM | POA: Diagnosis not present

## 2020-11-13 DIAGNOSIS — Z85828 Personal history of other malignant neoplasm of skin: Secondary | ICD-10-CM | POA: Diagnosis not present

## 2020-12-26 DIAGNOSIS — E1169 Type 2 diabetes mellitus with other specified complication: Secondary | ICD-10-CM | POA: Diagnosis not present

## 2020-12-26 DIAGNOSIS — M1990 Unspecified osteoarthritis, unspecified site: Secondary | ICD-10-CM | POA: Diagnosis not present

## 2020-12-26 DIAGNOSIS — E7800 Pure hypercholesterolemia, unspecified: Secondary | ICD-10-CM | POA: Diagnosis not present

## 2020-12-26 DIAGNOSIS — G250 Essential tremor: Secondary | ICD-10-CM | POA: Diagnosis not present

## 2021-01-02 DIAGNOSIS — Z961 Presence of intraocular lens: Secondary | ICD-10-CM | POA: Diagnosis not present

## 2021-01-02 DIAGNOSIS — H524 Presbyopia: Secondary | ICD-10-CM | POA: Diagnosis not present

## 2021-01-02 DIAGNOSIS — H26492 Other secondary cataract, left eye: Secondary | ICD-10-CM | POA: Diagnosis not present

## 2021-01-02 DIAGNOSIS — I10 Essential (primary) hypertension: Secondary | ICD-10-CM | POA: Diagnosis not present

## 2021-01-02 DIAGNOSIS — H5203 Hypermetropia, bilateral: Secondary | ICD-10-CM | POA: Diagnosis not present

## 2021-01-02 DIAGNOSIS — H52223 Regular astigmatism, bilateral: Secondary | ICD-10-CM | POA: Diagnosis not present

## 2021-02-21 DIAGNOSIS — K5730 Diverticulosis of large intestine without perforation or abscess without bleeding: Secondary | ICD-10-CM | POA: Diagnosis not present

## 2021-02-21 DIAGNOSIS — Z1211 Encounter for screening for malignant neoplasm of colon: Secondary | ICD-10-CM | POA: Diagnosis not present

## 2021-03-28 DIAGNOSIS — E1169 Type 2 diabetes mellitus with other specified complication: Secondary | ICD-10-CM | POA: Diagnosis not present

## 2021-03-28 DIAGNOSIS — Z23 Encounter for immunization: Secondary | ICD-10-CM | POA: Diagnosis not present

## 2021-05-22 DIAGNOSIS — C801 Malignant (primary) neoplasm, unspecified: Secondary | ICD-10-CM

## 2021-05-22 HISTORY — DX: Malignant (primary) neoplasm, unspecified: C80.1

## 2021-05-26 DIAGNOSIS — Q828 Other specified congenital malformations of skin: Secondary | ICD-10-CM | POA: Diagnosis not present

## 2021-05-26 DIAGNOSIS — L570 Actinic keratosis: Secondary | ICD-10-CM | POA: Diagnosis not present

## 2021-05-26 DIAGNOSIS — C4362 Malignant melanoma of left upper limb, including shoulder: Secondary | ICD-10-CM | POA: Diagnosis not present

## 2021-05-26 DIAGNOSIS — L821 Other seborrheic keratosis: Secondary | ICD-10-CM | POA: Diagnosis not present

## 2021-05-26 DIAGNOSIS — C44719 Basal cell carcinoma of skin of left lower limb, including hip: Secondary | ICD-10-CM | POA: Diagnosis not present

## 2021-05-26 DIAGNOSIS — Z85828 Personal history of other malignant neoplasm of skin: Secondary | ICD-10-CM | POA: Diagnosis not present

## 2021-05-26 DIAGNOSIS — D485 Neoplasm of uncertain behavior of skin: Secondary | ICD-10-CM | POA: Diagnosis not present

## 2021-05-26 DIAGNOSIS — D1801 Hemangioma of skin and subcutaneous tissue: Secondary | ICD-10-CM | POA: Diagnosis not present

## 2021-06-20 ENCOUNTER — Other Ambulatory Visit: Payer: Self-pay | Admitting: General Surgery

## 2021-06-20 DIAGNOSIS — C4362 Malignant melanoma of left upper limb, including shoulder: Secondary | ICD-10-CM

## 2021-06-25 ENCOUNTER — Encounter (HOSPITAL_COMMUNITY): Payer: Self-pay

## 2021-06-25 NOTE — Progress Notes (Signed)
Surgical Instructions    Your procedure is scheduled on Thursday, January 12th.  Report to [facility] Main Entrance "A" at 12:30 P.M., then check in with the Admitting office.  Call this number if you have problems the morning of surgery:  [phone]   If you have any questions prior to your surgery date call [phone]: Open Monday-Friday 8am-4pm    Remember:  Do not eat after midnight the night before your surgery  You may drink clear liquids until 11:30 AM the morning of your surgery.   Clear liquids allowed are: Water, Non-Citrus Juices (without pulp), Carbonated Beverages, Clear Tea, Black Coffee ONLY (NO MILK, CREAM OR POWDERED CREAMER of any kind), and Gatorade    Take these medicines the morning of surgery with A SIP OF WATER  Atorvastatin (Lipitor)  Propranolol (Inderal)  Follow your surgeon's instructions on when to stop Aspirin.  If no instructions were given by your surgeon then you will need to call the office to get those instructions.    As of today, STOP taking Aleve, Naproxen, Ibuprofen, Motrin, Advil, Goody's, BC's, all herbal medications, fish oil, and all vitamins.   WHAT DO I DO ABOUT MY DIABETES MEDICATION?  Do not take oral diabetes medicines (pills) the morning of surgery. - Metformin  HOW TO MANAGE YOUR DIABETES BEFORE AND AFTER SURGERY  Why is it important to control my blood sugar before and after surgery? Improving blood sugar levels before and after surgery helps healing and can limit problems. A way of improving blood sugar control is eating a healthy diet by:  Eating less sugar and carbohydrates  Increasing activity/exercise  Talking with your doctor about reaching your blood sugar goals High blood sugars (greater than 180 mg/dL) can raise your risk of infections and slow your recovery, so you will need to focus on controlling your diabetes during the weeks before surgery. Make sure that the doctor who takes care of your diabetes knows about  your planned surgery including the date and location.  How do I manage my blood sugar before surgery? Check your blood sugar at least 4 times a day, starting 2 days before surgery, to make sure that the level is not too high or low.  Check your blood sugar the morning of your surgery when you wake up and every 2 hours until you get to the Short Stay unit.  If your blood sugar is less than 70 mg/dL, you will need to treat for low blood sugar: Do not take insulin. Treat a low blood sugar (less than 70 mg/dL) with  cup of clear juice (cranberry or apple), 4 glucose tablets, OR glucose gel. Recheck blood sugar in 15 minutes after treatment (to make sure it is greater than 70 mg/dL). If your blood sugar is not greater than 70 mg/dL on recheck, call [phone] for further instructions. Report your blood sugar to the short stay nurse when you get to Short Stay.  If you are admitted to the hospital after surgery: Your blood sugar will be checked by the staff and you will probably be given insulin after surgery (instead of oral diabetes medicines) to make sure you have good blood sugar levels. The goal for blood sugar control after surgery is 80-180 mg/dL.   DAY OF SURGERY:         Do not wear jewelry, makeup, or nail polish Do not wear lotions, powders, perfumes, or deodorant. Do not shave 48 hours prior to surgery.   Do not bring valuables to  the hospital.             [facility] is not responsible for any belongings or valuables.  Do NOT Smoke (Tobacco/Vaping)  24 hours prior to your procedure  If you use a CPAP at night, you may bring your mask for your overnight stay.   Contacts, glasses, hearing aids, dentures or partials may not be worn into surgery, please bring cases for these belongings   For patients admitted to the hospital, discharge time will be determined by your treatment team.   Patients discharged the day of surgery will not be allowed to drive home, and someone needs to  stay with them for 24 hours.  NO VISITORS WILL BE ALLOWED IN PRE-OP WHERE PATIENTS ARE PREPPED FOR SURGERY.  ONLY 1 SUPPORT PERSON MAY BE PRESENT IN THE WAITING ROOM WHILE YOU ARE IN SURGERY.  IF YOU ARE TO BE ADMITTED, ONCE YOU ARE IN YOUR ROOM YOU WILL BE ALLOWED TWO (2) VISITORS. 1 (ONE) VISITOR MAY STAY OVERNIGHT BUT MUST ARRIVE TO THE ROOM BY 8pm.  Minor children may have two parents present. Special consideration for safety and communication needs will be reviewed on a case by case basis.  Special instructions:    Oral Hygiene is also important to reduce your risk of infection.  Remember - BRUSH YOUR TEETH THE MORNING OF SURGERY WITH YOUR REGULAR TOOTHPASTE   [place]- Preparing For Surgery  Before surgery, you can play an important role. Because skin is not sterile, your skin needs to be as free of germs as possible. You can reduce the number of germs on your skin by washing with CHG (chlorahexidine gluconate) Soap before surgery.  CHG is an antiseptic cleaner which kills germs and bonds with the skin to continue killing germs even after washing.     Please do not use if you have an allergy to CHG or antibacterial soaps. If your skin becomes reddened/irritated stop using the CHG.  Do not shave (including legs and underarms) for at least 48 hours prior to first CHG shower. It is OK to shave your face.  Please follow these instructions carefully.     Shower the NIGHT BEFORE SURGERY and the MORNING OF SURGERY with CHG Soap.   If you chose to wash your hair, wash your hair first as usual with your normal shampoo. After you shampoo, rinse your hair and body thoroughly to remove the shampoo.  Then [employer] and genitals (private parts) with your normal soap and rinse thoroughly to remove soap.  After that Use CHG Soap as you would any other liquid soap. You can apply CHG directly to the skin and wash gently with a scrungie or a clean washcloth.   Apply the CHG Soap to your body ONLY FROM  THE NECK DOWN.  Do not use on open wounds or open sores. Avoid contact with your eyes, ears, mouth and genitals (private parts). Wash Face and genitals (private parts)  with your normal soap.   Wash thoroughly, paying special attention to the area where your surgery will be performed.  Thoroughly rinse your body with warm water from the neck down.  DO NOT shower/wash with your normal soap after using and rinsing off the CHG Soap.  Pat yourself dry with a CLEAN TOWEL.  Wear CLEAN PAJAMAS to bed the night before surgery  Place CLEAN SHEETS on your bed the night before your surgery  DO NOT SLEEP WITH PETS.   Day of Surgery:  Take a  shower with CHG soap. Wear Clean/Comfortable clothing the morning of surgery Do not apply any deodorants/lotions.   Remember to brush your teeth WITH YOUR REGULAR TOOTHPASTE.   Please read over the following fact sheets that you were given.

## 2021-06-26 ENCOUNTER — Encounter (HOSPITAL_COMMUNITY): Payer: Self-pay

## 2021-06-26 ENCOUNTER — Other Ambulatory Visit: Payer: Self-pay

## 2021-06-26 ENCOUNTER — Encounter (HOSPITAL_COMMUNITY)
Admission: RE | Admit: 2021-06-26 | Discharge: 2021-06-26 | Disposition: A | Discharge status: Home / Self Care | Payer: PPO | Source: Ambulatory Visit | Attending: General Surgery | Admitting: General Surgery

## 2021-06-26 VITALS — BP 122/55 | HR 47 | Temp 97.9°F | Resp 17 | Ht 63.0 in | Wt 140.0 lb

## 2021-06-26 DIAGNOSIS — Z01818 Encounter for other preprocedural examination: Secondary | ICD-10-CM | POA: Insufficient documentation

## 2021-06-26 HISTORY — DX: Type 2 diabetes mellitus without complications: E11.9

## 2021-06-26 LAB — CBC
HCT: 40.2 % (ref 36.0–46.0)
Hemoglobin: 13.1 g/dL (ref 12.0–15.0)
MCH: 28.9 pg (ref 26.0–34.0)
MCHC: 32.6 g/dL (ref 30.0–36.0)
MCV: 88.5 fL (ref 80.0–100.0)
Platelets: 281 10*3/uL (ref 150–400)
RBC: 4.54 MIL/uL (ref 3.87–5.11)
RDW: 12.3 % (ref 11.5–15.5)
WBC: 6.7 10*3/uL (ref 4.0–10.5)
nRBC: 0 % (ref 0.0–0.2)

## 2021-06-26 LAB — BASIC METABOLIC PANEL
Anion gap: 7 (ref 5–15)
BUN: 16 mg/dL (ref 8–23)
CO2: 24 mmol/L (ref 22–32)
Calcium: 9.3 mg/dL (ref 8.9–10.3)
Chloride: 106 mmol/L (ref 98–111)
Creatinine, Ser: 0.66 mg/dL (ref 0.44–1.00)
GFR, Estimated: >60 mL/min (ref >60)
Glucose, Bld: 128 mg/dL — ABNORMAL HIGH (ref 70–99)
Potassium: 4.6 mmol/L (ref 3.5–5.1)
Sodium: 137 mmol/L (ref 135–145)

## 2021-06-26 LAB — HEMOGLOBIN A1C
Hgb A1c MFr Bld: 7 % — ABNORMAL HIGH (ref 4.8–5.6)
Mean Plasma Glucose: 154.2 mg/dL

## 2021-06-26 LAB — GLUCOSE, CAPILLARY: Glucose-Capillary: 146 mg/dL — ABNORMAL HIGH (ref 70–99)

## 2021-06-26 NOTE — Progress Notes (Signed)
PCP - Mayra Neer Cardiologist - denies  Chest x-ray - n/a EKG - 06/26/21   DM - Type 2 Fasting Blood Sugar - 125 CBG machine is currently broken, patient currently unable to check CBGs  Aspirin Instructions: Follow your surgeon's instructions on when to stop ASA  ERAS Protcol - yes, no drink ordered or given   COVID TEST- n/a (ambulatory)   Anesthesia review: n/a  Patient denies shortness of breath, fever, cough and chest pain at PAT appointment   All instructions explained to the patient, with a verbal understanding of the material. Patient agrees to go over the instructions while at home for a better understanding. Patient also instructed to self quarantine after being tested for COVID-19. The opportunity to ask questions was provided.

## 2021-07-02 NOTE — Anesthesia Preprocedure Evaluation (Addendum)
Anesthesia Evaluation  Patient identified by MRN, date of birth, ID band Patient awake    Reviewed: Allergy & Precautions, NPO status , Patient's Chart, lab work & pertinent test results  History of Anesthesia Complications Negative for: history of anesthetic complications  Airway Mallampati: II  TM Distance: >3 FB Neck ROM: Full    Dental  (+) Upper Dentures, Lower Dentures, Dental Advisory Given   Pulmonary sleep apnea ,    Pulmonary exam normal        Cardiovascular negative cardio ROS Normal cardiovascular exam     Neuro/Psych negative neurological ROS     GI/Hepatic negative GI ROS, Neg liver ROS,   Endo/Other  diabetes  Renal/GU negative Renal ROS     Musculoskeletal negative musculoskeletal ROS (+)   Abdominal   Peds  Hematology negative hematology ROS (+)   Anesthesia Other Findings   Reproductive/Obstetrics                            Anesthesia Physical Anesthesia Plan  ASA: 3  Anesthesia Plan: General   Post-op Pain Management: Celebrex PO (pre-op) and Tylenol PO (pre-op)   Induction:   PONV Risk Score and Plan: 4 or greater and Ondansetron, Dexamethasone and Diphenhydramine  Airway Management Planned: LMA  Additional Equipment:   Intra-op Plan:   Post-operative Plan: Extubation in OR  Informed Consent: I have reviewed the patients History and Physical, chart, labs and discussed the procedure including the risks, benefits and alternatives for the proposed anesthesia with the patient or authorized representative who has indicated his/her understanding and acceptance.     Dental advisory given  Plan Discussed with: Anesthesiologist and CRNA  Anesthesia Plan Comments:        Anesthesia Quick Evaluation

## 2021-07-02 NOTE — Progress Notes (Signed)
Patient aware to arrive at 0730 DOS.

## 2021-07-02 NOTE — H&P (Signed)
REFERRING PHYSICIAN: Duard Brady, *  Patient Care Team: Mayra Neer, MD as PCP - General (Family Medicine) Smith Mince, MD (Dermatology) Georgianne Fick, MD as Consulting Provider (Surgical Oncology)  PROVIDER: Georgianne Fick, MD  MRN: [id number] DOB: [birth year] DATE OF ENCOUNTER: 06/20/2021 Subjective   Chief Complaint: Melanoma - Left Arm   History of Present Illness: Brianna [name] is a 81 y.o. female who is seen today as an office consultation at the request of Dr. Cristina Gong for evaluation of Melanoma - Left Arm  Patient is an 81 year old female who presents with a new diagnosis of melanoma of the left elbow. She was going for her regular skin check when Dr. Jarome Matin found the site to be concerning. She had had a pigmented spot in the same location for at least 2 years. She has not noticed any significant change. He did a shave biopsy and this was noted to be a superficial spreading melanoma, Clark's level 3, Breslow depth 1.5 mm. Peripheral and deep margins were involved. There was no ulceration, satellitosis, mitoses, lymphovascular invasion, or neurotropism. There were brisk tumor infiltrating lymphocytes and tumor regression was present.  The patient does have a long history of sunbathing. She loves being out in the sun and has done so for her entire life. 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   Review of Systems: A complete review of systems was obtained from the patient. I have reviewed this information and discussed as appropriate with the patient. See HPI as well for other ROS.  Review of Systems  HENT: Positive for congestion and hearing loss.  All other systems reviewed and are negative.   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)    Past Surgical History:  Procedure Laterality Date   HYSTERECTOMY 1972   Tonsilectomy    No Known Allergies  Current Outpatient Medications on File Prior to Visit  Medication Sig Dispense Refill   aspirin 81 MG chewable tablet 1 tablet   atorvastatin (LIPITOR) 20 MG tablet Take 1 tablet by mouth once daily   calcium carbonate-vitamin D3 (OS-CAL 500+D) 500 mg-5 mcg (200 unit) tablet Take 1 tablet by mouth   cholecalciferol (VITAMIN D3) 1000 unit tablet Take by mouth   docosahexaenoic acid-epa 120-180 mg Cap Take by mouth   metFORMIN (GLUCOPHAGE) 500 MG tablet 1 tablet with a meal   propranoloL (INDERAL) 40 MG tablet Take 1 tablet by mouth 2 (two) times daily   No current facility-administered medications on file prior to visit.   Family History  Problem Relation Age of Onset   Ovarian cancer Sister   Breast cancer Maternal Aunt   Stomach cancer Maternal Grandmother    Social History   Tobacco Use  Smoking Status Former   Types: Cigarettes   Quit date: 2007   Years since quitting: 16.0  Smokeless Tobacco Never    Social History   Socioeconomic History   Marital status: Married  Tobacco Use   Smoking status: Former  Types: Cigarettes  Quit date: 2007  Years since quitting: 16.0   Smokeless tobacco: Never  Substance and Sexual Activity   Alcohol use: Yes  Comment: Occasionally   Drug use: Never   Objective:   Vitals:  06/20/21 0837  BP: 122/68  Pulse: 65  Temp: 36.7 C (98  F)  SpO2: 95%  Weight: 63.8 kg (140 lb 9.6 oz)  Height: 160 cm (5\' 3" )   Body mass index is 24.91 kg/m.  Head: Normocephalic and atraumatic.  Mouth/Throat: Oropharynx is clear and moist. No oropharyngeal exudate.  Eyes: Conjunctivae are normal. Pupils are equal, round, and reactive to light. No scleral icterus.  Neck: Normal range of motion. Neck supple. No tracheal deviation present. No thyromegaly present.  Cardiovascular: Normal rate, regular rhythm, normal heart sounds and  intact distal pulses. Exam reveals no gallop and no friction rub.  No murmur heard. Respiratory: Effort normal and breath sounds normal. No respiratory distress. No wheezes, rales or rhonchi. No chest wall tenderness.  GI: Soft. Bowel sounds are normal. Abdomen is soft, non tender, non distended. No masses or hepatosplenomegaly is present. There is no rebound and no guarding.  Musculoskeletal: . Extremities are non tender and without deformity.  Lymphadenopathy: No cervical or axillary adenopathy.  Neurological: Alert and oriented to person, place, and time. Coordination normal.  Skin: Skin is warm and dry. No rash noted. No diaphoresis. No erythema. No pallor.  Left elbow with biopsy site laterally over proximal brachioradialis. Some pigment remains and there is still some raised component. This is brown and even in color.  Psychiatric: Normal mood and affect.Behavior is normal. Judgment and thought content normal.   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   I discussed surgery with the patient and her family. I will plan 1 cm margins given the fact that this is over the joint.   I discussed risks of surgery including bleeding, infection, numbness, wound breakdown, tightness at the elbow, seroma, risk of melanoma recurrence and more. I reviewed post op restrictions and recovery. She wishes to proceed.  I advised them that I would order a PET scan and refer to oncology if the node is positive. They can then discuss immunotherapy.   We will do this at the first mutually available opportunity.    No follow-ups on file.  Georgianne Fick, MD

## 2021-07-03 ENCOUNTER — Encounter (HOSPITAL_COMMUNITY)
Admission: RE | Disposition: A | Discharge status: Home / Self Care | Payer: Self-pay | Source: Home / Self Care | Attending: General Surgery

## 2021-07-03 ENCOUNTER — Encounter (HOSPITAL_COMMUNITY): Payer: Self-pay | Admitting: General Surgery

## 2021-07-03 ENCOUNTER — Ambulatory Visit (HOSPITAL_COMMUNITY): Payer: PPO | Admitting: Anesthesiology

## 2021-07-03 ENCOUNTER — Ambulatory Visit (HOSPITAL_COMMUNITY)
Admission: RE | Admit: 2021-07-03 | Discharge: 2021-07-03 | Disposition: A | Discharge status: Home / Self Care | Payer: PPO | Source: Ambulatory Visit | Attending: General Surgery | Admitting: General Surgery

## 2021-07-03 ENCOUNTER — Ambulatory Visit (HOSPITAL_COMMUNITY)
Admission: RE | Admit: 2021-07-03 | Discharge: 2021-07-03 | Disposition: A | Discharge status: Home / Self Care | Payer: PPO | Attending: General Surgery | Admitting: General Surgery

## 2021-07-03 ENCOUNTER — Ambulatory Visit (HOSPITAL_COMMUNITY): Payer: PPO

## 2021-07-03 DIAGNOSIS — C4362 Malignant melanoma of left upper limb, including shoulder: Secondary | ICD-10-CM | POA: Diagnosis not present

## 2021-07-03 DIAGNOSIS — Z87891 Personal history of nicotine dependence: Secondary | ICD-10-CM | POA: Diagnosis not present

## 2021-07-03 DIAGNOSIS — Z7984 Long term (current) use of oral hypoglycemic drugs: Secondary | ICD-10-CM | POA: Diagnosis not present

## 2021-07-03 DIAGNOSIS — Z79899 Other long term (current) drug therapy: Secondary | ICD-10-CM | POA: Diagnosis not present

## 2021-07-03 DIAGNOSIS — E785 Hyperlipidemia, unspecified: Secondary | ICD-10-CM | POA: Insufficient documentation

## 2021-07-03 DIAGNOSIS — E119 Type 2 diabetes mellitus without complications: Secondary | ICD-10-CM | POA: Diagnosis not present

## 2021-07-03 DIAGNOSIS — G4730 Sleep apnea, unspecified: Secondary | ICD-10-CM | POA: Insufficient documentation

## 2021-07-03 DIAGNOSIS — G8918 Other acute postprocedural pain: Secondary | ICD-10-CM | POA: Diagnosis not present

## 2021-07-03 DIAGNOSIS — D0362 Melanoma in situ of left upper limb, including shoulder: Secondary | ICD-10-CM | POA: Diagnosis not present

## 2021-07-03 HISTORY — PX: EXCISION MELANOMA WITH SENTINEL LYMPH NODE BIOPSY: SHX5628

## 2021-07-03 LAB — GLUCOSE, CAPILLARY
Glucose-Capillary: 154 mg/dL — ABNORMAL HIGH (ref 70–99)
Glucose-Capillary: 158 mg/dL — ABNORMAL HIGH (ref 70–99)

## 2021-07-03 SURGERY — EXCISION, MELANOMA, WITH SENTINEL LYMPH NODE BIOPSY
Anesthesia: General | Laterality: Left

## 2021-07-03 MED ORDER — CHLORHEXIDINE GLUCONATE CLOTH 2 % EX PADS: 6.0000 | MEDICATED_PAD | Freq: Once | CUTANEOUS | Status: DC

## 2021-07-03 MED ORDER — CEFAZOLIN SODIUM-DEXTROSE 2-4 GM/100ML-% IV SOLN
2.0000 g | INTRAVENOUS | Status: AC
  Administered 2021-07-03: 2 g via INTRAVENOUS
  Filled 2021-07-03: qty 100

## 2021-07-03 MED ORDER — BUPIVACAINE-EPINEPHRINE (PF) 0.25% -1:200000 IJ SOLN
INTRAMUSCULAR | Status: AC
  Filled 2021-07-03: qty 30

## 2021-07-03 MED ORDER — FENTANYL CITRATE (PF) 100 MCG/2ML IJ SOLN: 100.0000 ug | Freq: Once | INTRAMUSCULAR | Status: AC

## 2021-07-03 MED ORDER — OXYCODONE HCL 5 MG PO TABS
5.0000 mg | ORAL_TABLET | Freq: Once | ORAL | Status: AC
  Administered 2021-07-03: 5 mg via ORAL

## 2021-07-03 MED ORDER — DEXMEDETOMIDINE (PRECEDEX) IN NS 20 MCG/5ML (4 MCG/ML) IV SYRINGE
PREFILLED_SYRINGE | INTRAVENOUS | Status: DC | PRN
  Administered 2021-07-03: 8 ug via INTRAVENOUS

## 2021-07-03 MED ORDER — LIDOCAINE 2% (20 MG/ML) 5 ML SYRINGE
INTRAMUSCULAR | Status: DC | PRN
  Administered 2021-07-03: 40 mg via INTRAVENOUS

## 2021-07-03 MED ORDER — DEXAMETHASONE SODIUM PHOSPHATE 10 MG/ML IJ SOLN
INTRAMUSCULAR | Status: DC | PRN
  Administered 2021-07-03: 5 mg via INTRAVENOUS

## 2021-07-03 MED ORDER — FENTANYL CITRATE (PF) 250 MCG/5ML IJ SOLN
INTRAMUSCULAR | Status: DC | PRN
  Administered 2021-07-03: 50 ug via INTRAVENOUS

## 2021-07-03 MED ORDER — FENTANYL CITRATE (PF) 250 MCG/5ML IJ SOLN
INTRAMUSCULAR | Status: AC
  Filled 2021-07-03: qty 5

## 2021-07-03 MED ORDER — LIDOCAINE HCL 1 % IJ SOLN
INTRAMUSCULAR | Status: AC
  Filled 2021-07-03: qty 20

## 2021-07-03 MED ORDER — FENTANYL CITRATE (PF) 100 MCG/2ML IJ SOLN
INTRAMUSCULAR | Status: AC
  Administered 2021-07-03: 100 ug via INTRAVENOUS
  Filled 2021-07-03: qty 2

## 2021-07-03 MED ORDER — CHLORHEXIDINE GLUCONATE 0.12 % MT SOLN
15.0000 mL | Freq: Once | OROMUCOSAL | Status: AC
  Administered 2021-07-03: 15 mL via OROMUCOSAL
  Filled 2021-07-03: qty 15

## 2021-07-03 MED ORDER — OXYCODONE HCL 5 MG PO TABS
ORAL_TABLET | ORAL | Status: AC
  Filled 2021-07-03: qty 1

## 2021-07-03 MED ORDER — TECHNETIUM TC 99M TILMANOCEPT KIT
0.5000 | PACK | Freq: Once | INTRAVENOUS | Status: AC | PRN
  Administered 2021-07-03: 0.5 via INTRADERMAL

## 2021-07-03 MED ORDER — BUPIVACAINE HCL (PF) 0.25 % IJ SOLN
INTRAMUSCULAR | Status: DC | PRN
  Administered 2021-07-03: 20 mL

## 2021-07-03 MED ORDER — PHENYLEPHRINE 40 MCG/ML (10ML) SYRINGE FOR IV PUSH (FOR BLOOD PRESSURE SUPPORT)
PREFILLED_SYRINGE | INTRAVENOUS | Status: DC | PRN
  Administered 2021-07-03: 40 ug via INTRAVENOUS

## 2021-07-03 MED ORDER — ROCURONIUM BROMIDE 10 MG/ML (PF) SYRINGE
PREFILLED_SYRINGE | INTRAVENOUS | Status: DC | PRN
  Administered 2021-07-03: 40 mg via INTRAVENOUS

## 2021-07-03 MED ORDER — BUPIVACAINE LIPOSOME 1.3 % IJ SUSP
INTRAMUSCULAR | Status: DC | PRN
Start: 2021-07-03 — End: 2021-07-03
  Administered 2021-07-03: 10 mL

## 2021-07-03 MED ORDER — ORAL CARE MOUTH RINSE: 15.0000 mL | Freq: Once | OROMUCOSAL | Status: AC

## 2021-07-03 MED ORDER — LACTATED RINGERS IV SOLN: INTRAVENOUS | Status: DC

## 2021-07-03 MED ORDER — LIDOCAINE HCL 1 % IJ SOLN
INTRAMUSCULAR | Status: DC | PRN
  Administered 2021-07-03: 13 mL via INTRAMUSCULAR

## 2021-07-03 MED ORDER — 0.9 % SODIUM CHLORIDE (POUR BTL) OPTIME
TOPICAL | Status: DC | PRN
  Administered 2021-07-03: 1000 mL

## 2021-07-03 MED ORDER — METHYLENE BLUE 1 % INJ SOLN
INTRAVENOUS | Status: DC | PRN
  Administered 2021-07-03: 1 mL via INTRADERMAL

## 2021-07-03 MED ORDER — OXYCODONE HCL 5 MG PO TABS: 2.5000 mg | ORAL_TABLET | Freq: Four times a day (QID) | ORAL | 0 refills | Status: AC | PRN

## 2021-07-03 MED ORDER — ONDANSETRON HCL 4 MG/2ML IJ SOLN
INTRAMUSCULAR | Status: DC | PRN
  Administered 2021-07-03: 4 mg via INTRAVENOUS

## 2021-07-03 MED ORDER — ACETAMINOPHEN 500 MG PO TABS
1000.0000 mg | ORAL_TABLET | ORAL | Status: DC
  Filled 2021-07-03: qty 2

## 2021-07-03 MED ORDER — PHENYLEPHRINE HCL-NACL 20-0.9 MG/250ML-% IV SOLN
INTRAVENOUS | Status: DC | PRN
  Administered 2021-07-03: 20 ug/min via INTRAVENOUS

## 2021-07-03 MED ORDER — PROPOFOL 10 MG/ML IV BOLUS
INTRAVENOUS | Status: DC | PRN
  Administered 2021-07-03: 120 mg via INTRAVENOUS

## 2021-07-03 MED ORDER — METHYLENE BLUE 0.5 % INJ SOLN
INTRAVENOUS | Status: AC
  Filled 2021-07-03: qty 10

## 2021-07-03 MED ORDER — SUGAMMADEX SODIUM 200 MG/2ML IV SOLN
INTRAVENOUS | Status: DC | PRN
  Administered 2021-07-03: 200 mg via INTRAVENOUS

## 2021-07-03 MED ORDER — PROPOFOL 10 MG/ML IV BOLUS
INTRAVENOUS | Status: AC
  Filled 2021-07-03: qty 20

## 2021-07-03 MED ORDER — SUCCINYLCHOLINE CHLORIDE 200 MG/10ML IV SOSY
PREFILLED_SYRINGE | INTRAVENOUS | Status: DC | PRN
  Administered 2021-07-03: 140 mg via INTRAVENOUS

## 2021-07-03 SURGICAL SUPPLY — 66 items
ADH SKN CLS APL DERMABOND .7 (GAUZE/BANDAGES/DRESSINGS) ×1
APL PRP STRL LF DISP 70% ISPRP (MISCELLANEOUS) ×1
APL SKNCLS STERI-STRIP NONHPOA (GAUZE/BANDAGES/DRESSINGS) ×1
BAG COUNTER SPONGE SURGICOUNT (BAG) ×2 IMPLANT
BAG SPNG CNTER NS LX DISP (BAG) ×1
BENZOIN TINCTURE PRP APPL 2/3 (GAUZE/BANDAGES/DRESSINGS) ×1 IMPLANT
BLADE SURG 10 STRL SS (BLADE) ×1 IMPLANT
BNDG COHESIVE 4X5 TAN STRL (GAUZE/BANDAGES/DRESSINGS) ×1 IMPLANT
BNDG GAUZE ELAST 4 BULKY (GAUZE/BANDAGES/DRESSINGS) ×1 IMPLANT
CANISTER SUCT 3000ML PPV (MISCELLANEOUS) ×2 IMPLANT
CHLORAPREP W/TINT 26 (MISCELLANEOUS) ×2 IMPLANT
CLIP VESOCCLUDE MED 24/CT (CLIP) ×2 IMPLANT
CLIP VESOCCLUDE SM WIDE 24/CT (CLIP) ×1 IMPLANT
CNTNR URN SCR LID CUP LEK RST (MISCELLANEOUS) ×3 IMPLANT
CONT SPEC 4OZ STRL OR WHT (MISCELLANEOUS) ×6
COVER MAYO STAND STRL (DRAPES) ×1 IMPLANT
COVER PROBE W GEL 5X96 (DRAPES) ×2 IMPLANT
COVER SURGICAL LIGHT HANDLE (MISCELLANEOUS) ×2 IMPLANT
DECANTER SPIKE VIAL GLASS SM (MISCELLANEOUS) ×1 IMPLANT
DERMABOND ADVANCED (GAUZE/BANDAGES/DRESSINGS) ×1
DERMABOND ADVANCED .7 DNX12 (GAUZE/BANDAGES/DRESSINGS) IMPLANT
DRAPE HALF SHEET 40X57 (DRAPES) ×1 IMPLANT
DRAPE LAPAROSCOPIC ABDOMINAL (DRAPES) ×1 IMPLANT
DRSG TEGADERM 4X4.75 (GAUZE/BANDAGES/DRESSINGS) ×3 IMPLANT
ELECT REM PT RETURN 9FT ADLT (ELECTROSURGICAL) ×2
ELECTRODE REM PT RTRN 9FT ADLT (ELECTROSURGICAL) ×1 IMPLANT
GAUZE SPONGE 2X2 8PLY STRL LF (GAUZE/BANDAGES/DRESSINGS) ×1 IMPLANT
GAUZE SPONGE 4X4 12PLY STRL (GAUZE/BANDAGES/DRESSINGS) ×1 IMPLANT
GLOVE SURG ENC MOIS LTX SZ6 (GLOVE) ×2 IMPLANT
GLOVE SURG UNDER LTX SZ6.5 (GLOVE) ×2 IMPLANT
GOWN STRL REUS W/ TWL LRG LVL3 (GOWN DISPOSABLE) ×2 IMPLANT
GOWN STRL REUS W/TWL 2XL LVL3 (GOWN DISPOSABLE) ×4 IMPLANT
GOWN STRL REUS W/TWL LRG LVL3 (GOWN DISPOSABLE) ×4
KIT BASIN OR (CUSTOM PROCEDURE TRAY) ×2 IMPLANT
KIT TURNOVER KIT B (KITS) ×2 IMPLANT
MARKER SKIN DUAL TIP RULER LAB (MISCELLANEOUS) ×2 IMPLANT
NDL 18GX1X1/2 (RX/OR ONLY) (NEEDLE) ×1 IMPLANT
NDL FILTER BLUNT 18X1 1/2 (NEEDLE) IMPLANT
NDL HYPO 25GX1X1/2 BEV (NEEDLE) ×1 IMPLANT
NEEDLE 18GX1X1/2 (RX/OR ONLY) (NEEDLE) ×2 IMPLANT
NEEDLE 22X1 1/2 (OR ONLY) (NEEDLE) ×2 IMPLANT
NEEDLE FILTER BLUNT 18X 1/2SAF (NEEDLE) ×1
NEEDLE FILTER BLUNT 18X1 1/2 (NEEDLE) ×1 IMPLANT
NEEDLE HYPO 25GX1X1/2 BEV (NEEDLE) ×2 IMPLANT
NS IRRIG 1000ML POUR BTL (IV SOLUTION) ×2 IMPLANT
PACK GENERAL/GYN (CUSTOM PROCEDURE TRAY) ×2 IMPLANT
PACK UNIVERSAL I (CUSTOM PROCEDURE TRAY) ×1 IMPLANT
PAD ARMBOARD 7.5X6 YLW CONV (MISCELLANEOUS) ×4 IMPLANT
PENCIL SMOKE EVACUATOR (MISCELLANEOUS) ×1 IMPLANT
SPECIMEN JAR MEDIUM (MISCELLANEOUS) ×1 IMPLANT
SPONGE GAUZE 2X2 STER 10/PKG (GAUZE/BANDAGES/DRESSINGS)
STOCKINETTE IMPERVIOUS 9X36 MD (GAUZE/BANDAGES/DRESSINGS) ×2 IMPLANT
STRIP CLOSURE SKIN 1/2X4 (GAUZE/BANDAGES/DRESSINGS) ×2 IMPLANT
SUT ETHILON 2 0 PSLX (SUTURE) ×1 IMPLANT
SUT ETHILON 3 0 FSL (SUTURE) ×1 IMPLANT
SUT ETHILON 3 0 PS 1 (SUTURE) ×1 IMPLANT
SUT MNCRL AB 4-0 PS2 18 (SUTURE) ×3 IMPLANT
SUT SILK 2 0 PERMA HAND 18 BK (SUTURE) ×2 IMPLANT
SUT SILK 2 0 SH (SUTURE) ×1 IMPLANT
SUT VIC AB 2-0 SH 27 (SUTURE) ×2
SUT VIC AB 2-0 SH 27XBRD (SUTURE) ×2 IMPLANT
SUT VIC AB 3-0 SH 27 (SUTURE) ×2
SUT VIC AB 3-0 SH 27X BRD (SUTURE) ×2 IMPLANT
SYR CONTROL 10ML LL (SYRINGE) ×4 IMPLANT
TOWEL GREEN STERILE (TOWEL DISPOSABLE) ×2 IMPLANT
TOWEL GREEN STERILE FF (TOWEL DISPOSABLE) ×2 IMPLANT

## 2021-07-03 NOTE — Anesthesia Postprocedure Evaluation (Signed)
Anesthesia Post Note  Patient: Brianna [name]  Procedure(s) Performed: WIDE LOCAL EXCISION WITH ADVANCEMENT FLAP CLOSURE LEFT ELBOW MELANOMA WITH SENTINEL LYMPH NODE MAPPING AND BIOPSY (Left)     Patient location during evaluation: PACU Anesthesia Type: General Level of consciousness: sedated Pain management: pain level controlled Vital Signs Assessment: post-procedure vital signs reviewed and stable Respiratory status: spontaneous breathing and respiratory function stable Cardiovascular status: stable Postop Assessment: no apparent nausea or vomiting Anesthetic complications: no   No notable events documented.  Last Vitals:  Vitals:   07/03/21 1219 07/03/21 1232  BP: (!) 120/50 (!) 121/48  Pulse: (!) 46 (!) 46  Resp: 14 16  Temp:  (!) 36.2 C  SpO2: 97% 96%    Last Pain:  Vitals:   07/03/21 1134  TempSrc:   PainSc: [place]

## 2021-07-03 NOTE — Op Note (Signed)
PRE-OPERATIVE DIAGNOSIS: cT2aN0 left elbow melanoma  POST-OPERATIVE DIAGNOSIS:  Same  PROCEDURE:  Procedure(s): Wide local excision 1 cm margins, advancement flap closure for defect 7.5 cm x 3.5 cm, left axillary sentinel lymph node mapping and biopsy  SURGEON:  Surgeon(s): Stark Klein, MD  ASSIST:  Izola Price, RNFA  ANESTHESIA:   local and general  DRAINS: none   LOCAL MEDICATIONS USED:  MARCAINE    and XYLOCAINE   SPECIMEN:  Source of Specimen:  Wide local excision left elbow melanoma, additional distal margin, additional proximal margin, three left axillary sentinel nodes  FINDINGS:  some residual pigment at biopsy site on left elbow.  Cps 520 SLN #1, 290 SLN #2, 150 SLN #3. Background count 29 cps  DISPOSITION OF SPECIMEN:  PATHOLOGY  COUNTS:  YES  PLAN OF CARE: Discharge to home after PACU  PATIENT DISPOSITION:  PACU - hemodynamically stable.    PROCEDURE:   Pt was identified in the holding area, taken to the OR, and placed supine on the OR table.  General anesthesia was induced.  Time out was performed according to the surgical safety checklist.  When all was correct, we continued.  One mL methylene blue was injected intradermally around the melanoma biopsy site.    The patient was placed into the supine position with the left arm out.  The left arm/upper chest/axilla was prepped and draped in sterile fashion.  The melanoma was identified and 1 cm margins were marked out.  Local was administered under the melanoma and the adjacent tissue.  A #10 blade was used to incise the skin around the melanoma.  The cautery was used to take the dissection down to the fascia.  The skin was marked in situ with orientation sutures.  The cautery was used to take the specimen off the fascia, and it was passed off the table.    Skin hooks were used to elevate the edges of the incision and the skin was freed up in all directions with the cautery to create advancement flaps.  The excess  tissue was taken off proximally and distally to avoid dog ears.  This was pulled together in an longitudinal orientation. The skin was pulled together to check the tension. . Deep interrupted 2-0 vicryl sutures were placed to relieve tension.  The skin was then reapproximated with 2-0 interrupted vicryl deep dermal sutures and 4-0 monocryl running subcuticular sutures.  Three interrupted 3-0 nylon horizontal mattress sutures were placed as well.    The point of maximum signal intensity was identified with the neoprobe in the left axilla.  A 4 cm incision was made with a #15 blade.  The subcutaneous tissues were divided with the cautery.  A Weitlaner retractor was used to assist with visualization.  The tonsil clamp was used to bluntly dissect the axillary fat pad.  Three deep sentinel lymph nodes were identified as described above.  The lymphovascular channels were clipped with hemoclips.  The nodes were passed off as specimens.  Hemostasis was achieved with the cautery.  The axilla was irrigated and closed with 3-0 Vicryl deep dermal interrupted sutures and 4-0 Monocryl running subcuticular suture.  The axilla was dressed with dermabond.    The melanoma site was cleaned, dried, and dressed with Benzoin, steristrips, gauze, and tegaderm.    Needle, sponge, and instrument counts were correct.  The patient was awakened from anesthesia and taken to the PACU in stable condition.

## 2021-07-03 NOTE — Anesthesia Procedure Notes (Signed)
Procedure Name: Intubation Date/Time: 07/03/2021 10:05 AM Performed by: Vonna Drafts, CRNA Pre-anesthesia Checklist: Patient identified, Emergency Drugs available, Suction available and Patient being monitored Patient Re-evaluated:Patient Re-evaluated prior to induction Oxygen Delivery Method: Circle system utilized Preoxygenation: Pre-oxygenation with 100% oxygen Induction Type: IV induction, Rapid sequence and Cricoid Pressure applied Ventilation: Mask ventilation without difficulty Laryngoscope Size: Mac and 3 Grade View: Grade I Tube type: Oral Tube size: 7.0 mm Number of attempts: 1 Airway Equipment and Method: Stylet and Oral airway Placement Confirmation: ETT inserted through vocal cords under direct vision, positive ETCO2 and breath sounds checked- equal and bilateral Secured at: 21 cm Tube secured with: Tape Dental Injury: Teeth and Oropharynx as per pre-operative assessment

## 2021-07-03 NOTE — Transfer of Care (Signed)
Immediate Anesthesia Transfer of Care Note  Patient: Brianna [name]  Procedure(s) Performed: WIDE LOCAL EXCISION WITH ADVANCEMENT FLAP CLOSURE LEFT ELBOW MELANOMA WITH SENTINEL LYMPH NODE MAPPING AND BIOPSY (Left)  Patient Location: PACU  Anesthesia Type:General  Level of Consciousness: drowsy  Airway & Oxygen Therapy: Patient Spontanous Breathing and Patient connected to face mask oxygen  Post-op Assessment: Report given to RN and Post -op Vital signs reviewed and stable  Post vital signs: Reviewed and stable  Last Vitals:  Vitals Value Taken Time  BP 102/43 07/03/21 1135  Temp    Pulse 58 07/03/21 1136  Resp 20 07/03/21 1136  SpO2 97 % 07/03/21 1136  Vitals shown include unvalidated device data.  Last Pain:  Vitals:   07/03/21 0843  TempSrc:   PainSc: 0-No pain      Patients Stated Pain Goal: 0 (78/29/56 2130)  Complications: No notable events documented.

## 2021-07-03 NOTE — Interval H&P Note (Signed)
History and Physical Interval Note:  07/03/2021 9:09 AM  Brianna [name]  has presented today for surgery, with the diagnosis of melanoma left elbow.  The various methods of treatment have been discussed with the patient and family. After consideration of risks, benefits and other options for treatment, the patient has consented to  Procedure(s): WIDE LOCAL EXCISION WITH ADVANCEMENT FLAP CLOSURE LEFT ELBOW MELANOMA WITH SENTINEL LYMPH NODE MAPPING AND BIOPSY (Left) as a surgical intervention.  The patient's history has been reviewed, patient examined, no change in status, stable for surgery.  I have reviewed the patient's chart and labs.  Questions were answered to the patient's satisfaction.     Stark Klein

## 2021-07-03 NOTE — Discharge Instructions (Addendum)
[facility] Office Phone Number [phone]   POST OP INSTRUCTIONS  Always review your discharge instruction sheet given to you by the facility where your surgery was performed.  IF YOU HAVE DISABILITY OR FAMILY LEAVE FORMS, YOU MUST BRING THEM TO THE OFFICE FOR PROCESSING.  DO NOT GIVE THEM TO YOUR DOCTOR.  A prescription for pain medication may be given to you upon discharge.  Take your pain medication as prescribed, if needed.  If narcotic pain medicine is not needed, then you may take acetaminophen (Tylenol) or ibuprofen (Advil) as needed. Take your usually prescribed medications unless otherwise directed If you need a refill on your pain medication, please contact your pharmacy.  They will contact our office to request authorization.  Prescriptions will not be filled after 5pm or on week-ends. You should eat very light the first 24 hours after surgery, such as soup, crackers, pudding, etc.  Resume your normal diet the day after surgery It is common to experience some constipation if taking pain medication after surgery.  Increasing fluid intake and taking a stool softener will usually help or prevent this problem from occurring.  A mild laxative (Milk of Magnesia or Miralax) should be taken according to package directions if there are no bowel movements after 48 hours. You may shower in 48 hours.  The surgical glue will flake off in 2-3 weeks.   ACTIVITIES:  No strenuous activity or heavy lifting for 1 week.   You may drive when you no longer are taking prescription pain medication, you can comfortably wear a seatbelt, and you can safely maneuver your car and apply brakes. RETURN TO WORK:  __________n/a_______________ You should see your doctor in the office for a follow-up appointment approximately three-four weeks after your surgery.    WHEN TO CALL YOUR DOCTOR: Fever over 101.0 Nausea and/or vomiting. Extreme swelling or bruising. Continued bleeding from  incision. Increased pain, redness, or drainage from the incision.  The clinic staff is available to answer your questions during regular business hours.  Please don't hesitate to call and ask to speak to one of the nurses for clinical concerns.  If you have a medical emergency, go to the nearest emergency room or call 911.  A surgeon from [facility] is always on call at the hospital.  For further questions, please visit [email or web address]   

## 2021-07-03 NOTE — Anesthesia Procedure Notes (Signed)
Anesthesia Regional Block: Pectoralis block   Pre-Anesthetic Checklist: , timeout performed,  Correct Patient, Correct Site, Correct Laterality,  Correct Procedure, Correct Position, site marked,  Risks and benefits discussed,  Surgical consent,  Pre-op evaluation,  At surgeon's request and post-op pain management  Laterality: Left  Prep: chloraprep       Needles:  Injection technique: Single-shot  Needle Type: Echogenic Stimulator Needle     Needle Length: 10cm  Needle Gauge: 21     Additional Needles:   Procedures:,,,, ultrasound used (permanent image in chart),,    Narrative:  Start time: 07/03/2021 8:36 AM End time: 07/03/2021 8:46 AM Injection made incrementally with aspirations every 5 mL.  Performed by: Personally

## 2021-07-04 ENCOUNTER — Encounter (HOSPITAL_COMMUNITY): Payer: Self-pay | Admitting: General Surgery

## 2021-07-10 LAB — SURGICAL PATHOLOGY

## 2021-07-15 DIAGNOSIS — E7800 Pure hypercholesterolemia, unspecified: Secondary | ICD-10-CM | POA: Diagnosis not present

## 2021-07-15 DIAGNOSIS — M8580 Other specified disorders of bone density and structure, unspecified site: Secondary | ICD-10-CM | POA: Diagnosis not present

## 2021-07-15 DIAGNOSIS — Z0000 Encounter for general adult medical examination without abnormal findings: Secondary | ICD-10-CM | POA: Diagnosis not present

## 2021-07-15 DIAGNOSIS — C439 Malignant melanoma of skin, unspecified: Secondary | ICD-10-CM | POA: Diagnosis not present

## 2021-07-15 DIAGNOSIS — E1169 Type 2 diabetes mellitus with other specified complication: Secondary | ICD-10-CM | POA: Diagnosis not present

## 2021-07-15 DIAGNOSIS — N8110 Cystocele, unspecified: Secondary | ICD-10-CM | POA: Diagnosis not present

## 2021-07-15 DIAGNOSIS — K7581 Nonalcoholic steatohepatitis (NASH): Secondary | ICD-10-CM | POA: Diagnosis not present

## 2021-07-15 DIAGNOSIS — G250 Essential tremor: Secondary | ICD-10-CM | POA: Diagnosis not present

## 2021-08-25 DIAGNOSIS — L570 Actinic keratosis: Secondary | ICD-10-CM | POA: Diagnosis not present

## 2021-08-25 DIAGNOSIS — Z85820 Personal history of malignant melanoma of skin: Secondary | ICD-10-CM | POA: Diagnosis not present

## 2021-08-25 DIAGNOSIS — L812 Freckles: Secondary | ICD-10-CM | POA: Diagnosis not present

## 2021-08-25 DIAGNOSIS — L821 Other seborrheic keratosis: Secondary | ICD-10-CM | POA: Diagnosis not present

## 2021-08-25 DIAGNOSIS — Z85828 Personal history of other malignant neoplasm of skin: Secondary | ICD-10-CM | POA: Diagnosis not present

## 2021-10-26 IMAGING — MG MM DIGITAL SCREENING BILAT W/ TOMO AND CAD
8 series · 9 of 24 positions shown · non-contrast
Comparison: Previous exam(s).

CLINICAL DATA: Screening.

EXAM:
DIGITAL SCREENING BILATERAL MAMMOGRAM WITH TOMOSYNTHESIS AND CAD
TECHNIQUE: Bilateral screening digital craniocaudal and mediolateral oblique
mammograms were obtained. Bilateral screening digital breast
tomosynthesis was performed. The images were evaluated with
computer-aided detection.

[L CC synth-2D]
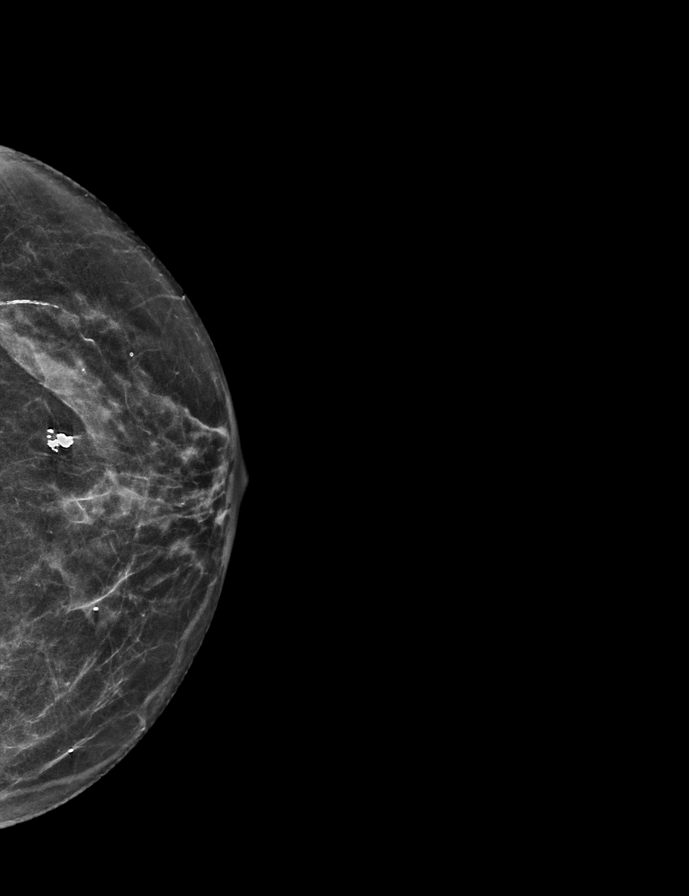

[R CC synth-2D]
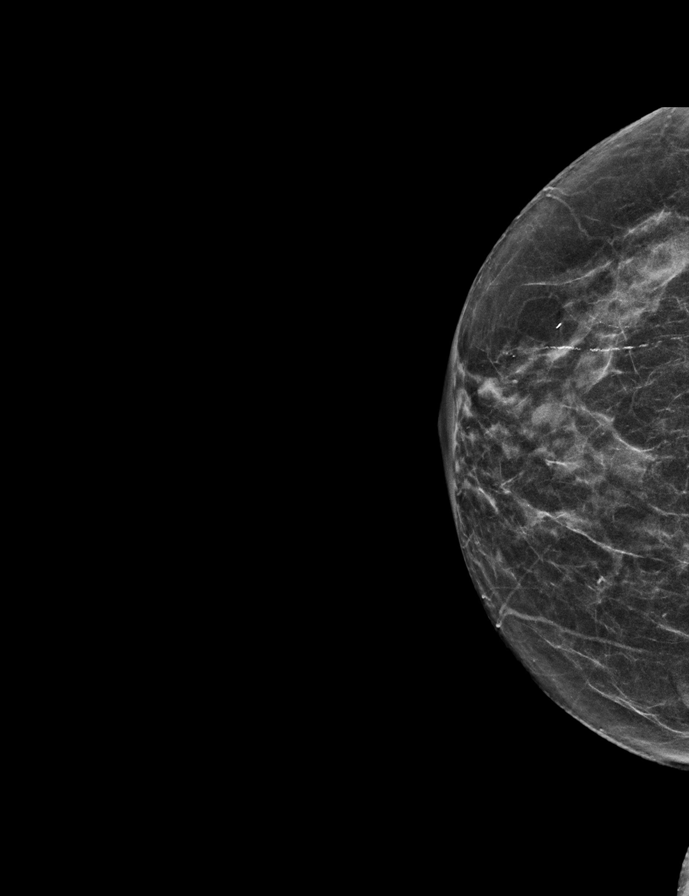

[L MLO synth-2D]
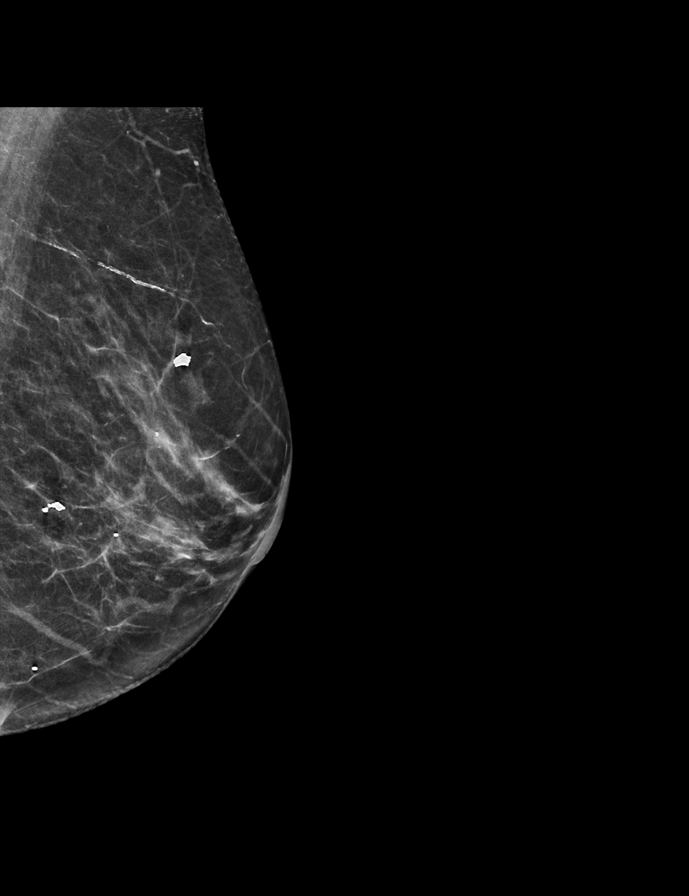

[R MLO synth-2D]
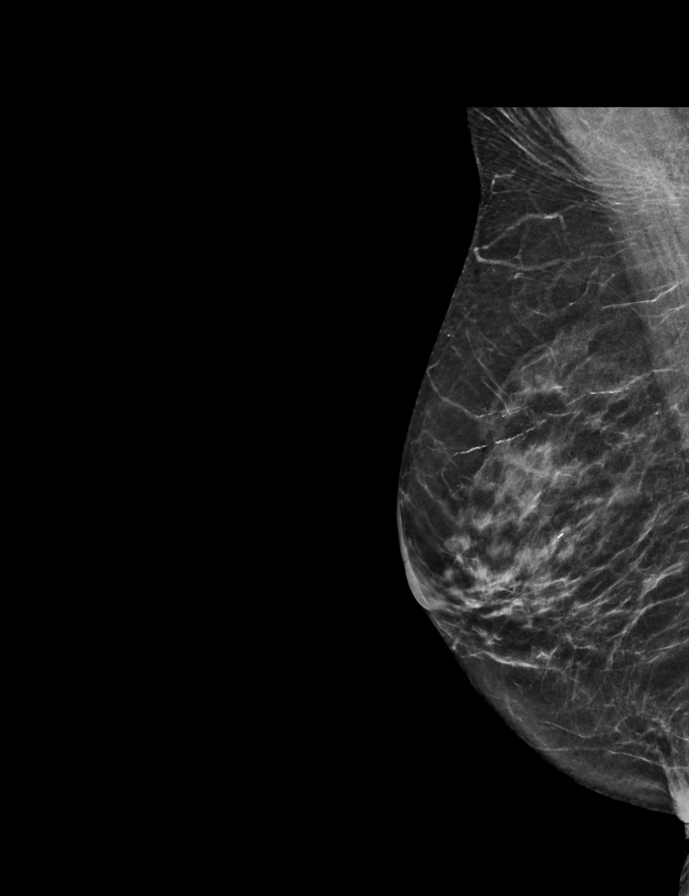

[L CC tomo · 2 of 49 frames shown]
[frame 16/49]
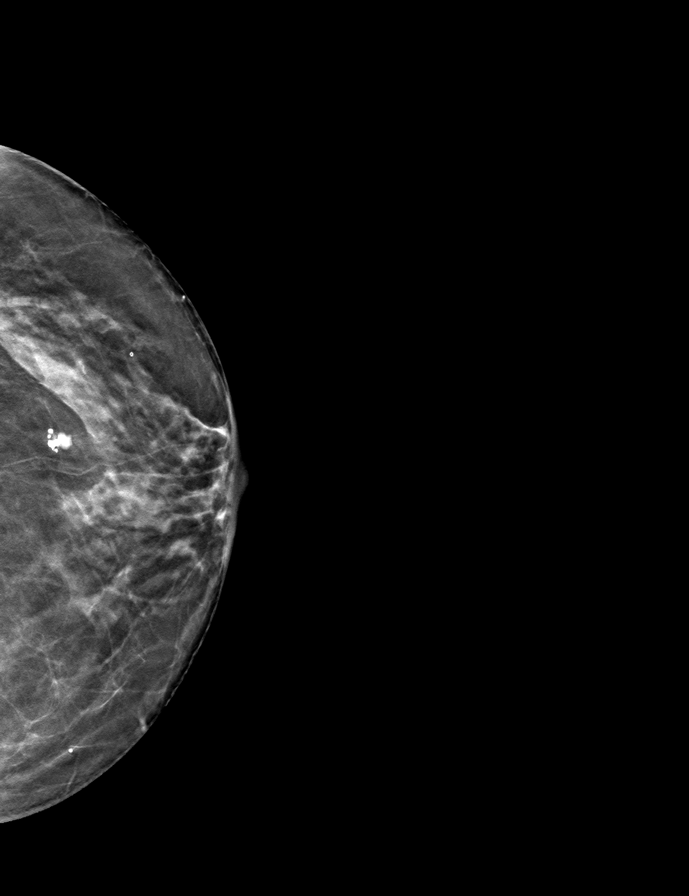
[frame 25/49]
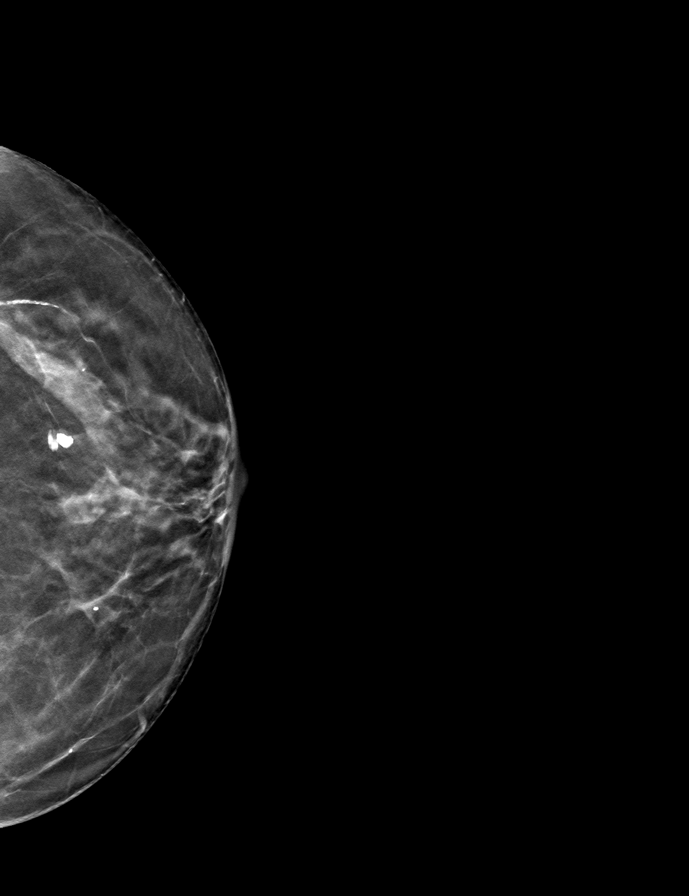

[R MLO tomo · tomo slice 26/51.0]
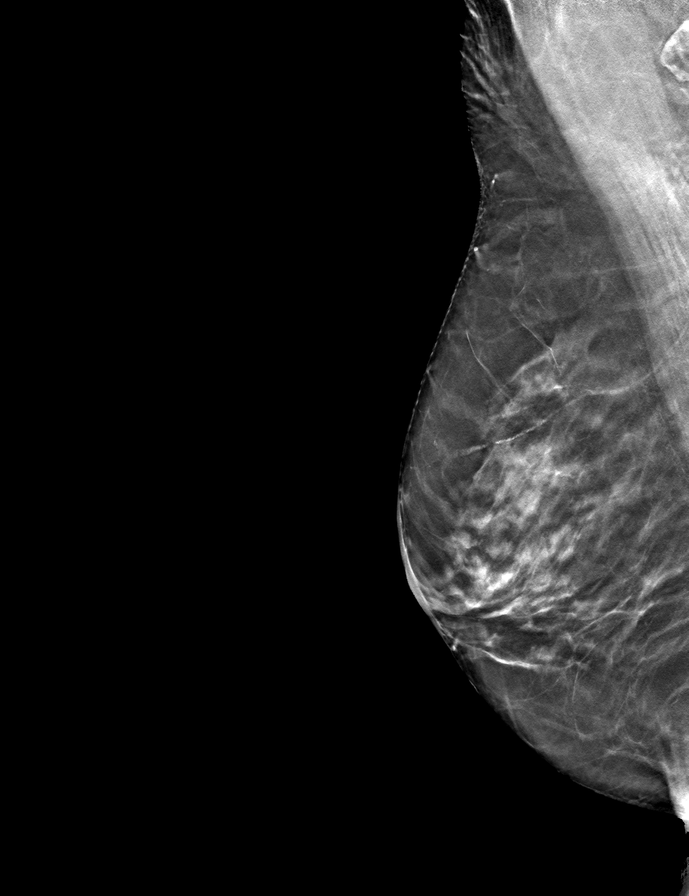

[L MLO tomo · tomo slice 27/52.0]
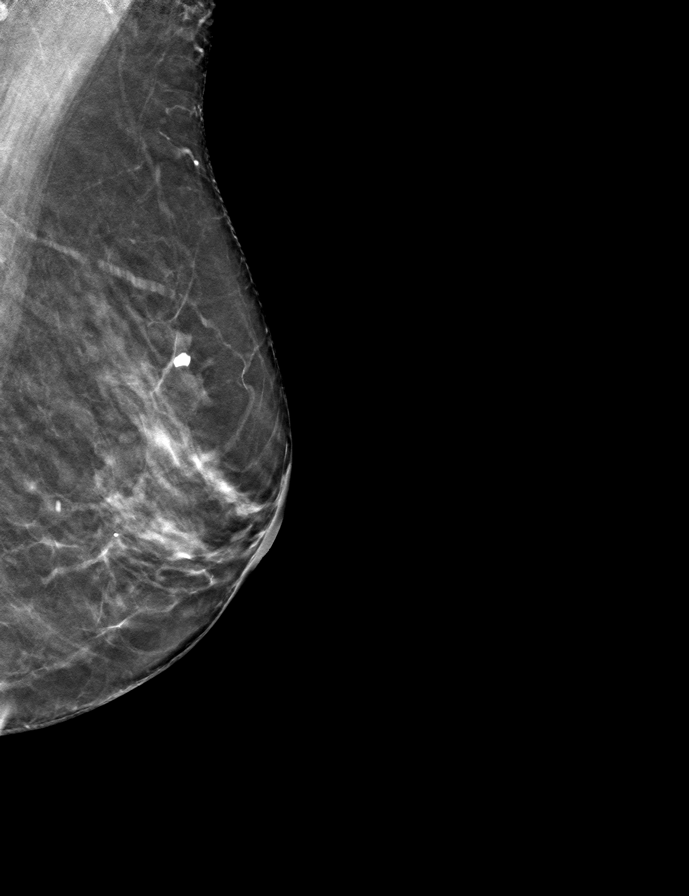

[R CC tomo · tomo slice 25/48.0]
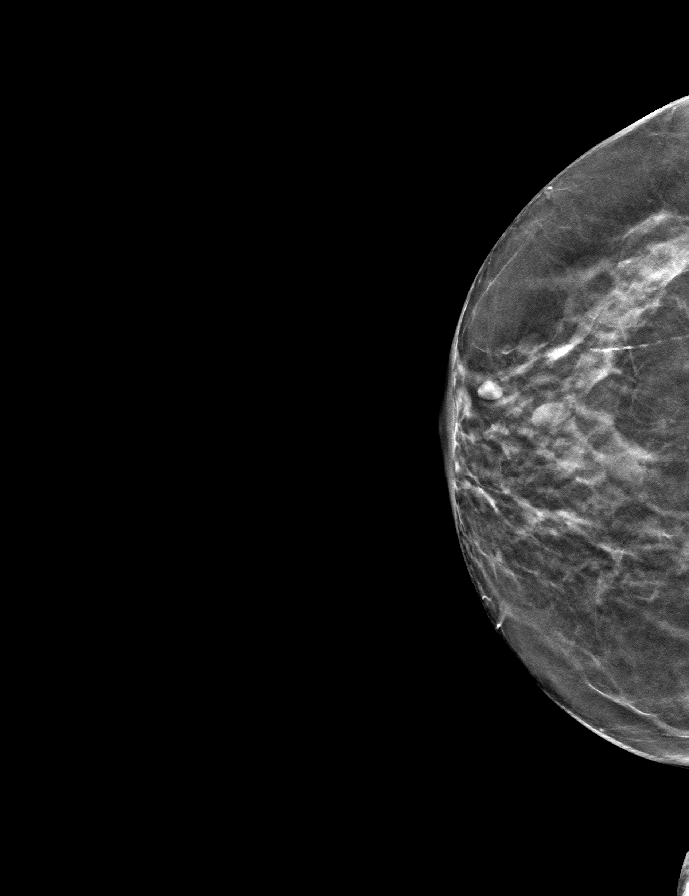

[9 of 24 positions shown; findings below may reference images not displayed]

ACR Breast Density Category b: There are scattered areas of
fibroglandular density.
FINDINGS: There are no findings suspicious for malignancy.
IMPRESSION: No mammographic evidence of malignancy. A result letter of this
screening mammogram will be mailed directly to the patient.

RECOMMENDATION:
Screening mammogram in one year. (Code:51-O-LD2)

BI-RADS CATEGORY  1: Negative.

## 2021-11-25 DIAGNOSIS — L570 Actinic keratosis: Secondary | ICD-10-CM | POA: Diagnosis not present

## 2021-11-25 DIAGNOSIS — D225 Melanocytic nevi of trunk: Secondary | ICD-10-CM | POA: Diagnosis not present

## 2021-11-25 DIAGNOSIS — L821 Other seborrheic keratosis: Secondary | ICD-10-CM | POA: Diagnosis not present

## 2021-11-25 DIAGNOSIS — L812 Freckles: Secondary | ICD-10-CM | POA: Diagnosis not present

## 2021-11-25 DIAGNOSIS — Z85820 Personal history of malignant melanoma of skin: Secondary | ICD-10-CM | POA: Diagnosis not present

## 2021-11-25 DIAGNOSIS — Z85828 Personal history of other malignant neoplasm of skin: Secondary | ICD-10-CM | POA: Diagnosis not present

## 2022-01-03 ENCOUNTER — Other Ambulatory Visit: Payer: Self-pay

## 2022-01-03 ENCOUNTER — Emergency Department (HOSPITAL_BASED_OUTPATIENT_CLINIC_OR_DEPARTMENT_OTHER): Payer: PPO

## 2022-01-03 ENCOUNTER — Encounter (HOSPITAL_BASED_OUTPATIENT_CLINIC_OR_DEPARTMENT_OTHER): Payer: Self-pay

## 2022-01-03 ENCOUNTER — Emergency Department (HOSPITAL_BASED_OUTPATIENT_CLINIC_OR_DEPARTMENT_OTHER)
Admission: EM | Admit: 2022-01-03 | Discharge: 2022-01-04 | Disposition: A | Discharge status: Home / Self Care | Payer: PPO | Attending: Emergency Medicine | Admitting: Emergency Medicine

## 2022-01-03 ENCOUNTER — Emergency Department (HOSPITAL_BASED_OUTPATIENT_CLINIC_OR_DEPARTMENT_OTHER): Payer: PPO | Admitting: Radiology

## 2022-01-03 DIAGNOSIS — S52502A Unspecified fracture of the lower end of left radius, initial encounter for closed fracture: Secondary | ICD-10-CM | POA: Insufficient documentation

## 2022-01-03 DIAGNOSIS — S066X0A Traumatic subarachnoid hemorrhage without loss of consciousness, initial encounter: Secondary | ICD-10-CM | POA: Insufficient documentation

## 2022-01-03 DIAGNOSIS — Z7982 Long term (current) use of aspirin: Secondary | ICD-10-CM | POA: Diagnosis not present

## 2022-01-03 DIAGNOSIS — Y92009 Unspecified place in unspecified non-institutional (private) residence as the place of occurrence of the external cause: Secondary | ICD-10-CM | POA: Insufficient documentation

## 2022-01-03 DIAGNOSIS — S62102A Fracture of unspecified carpal bone, left wrist, initial encounter for closed fracture: Secondary | ICD-10-CM

## 2022-01-03 DIAGNOSIS — S6292XA Unspecified fracture of left wrist and hand, initial encounter for closed fracture: Secondary | ICD-10-CM | POA: Diagnosis not present

## 2022-01-03 DIAGNOSIS — Z79899 Other long term (current) drug therapy: Secondary | ICD-10-CM | POA: Diagnosis not present

## 2022-01-03 DIAGNOSIS — I609 Nontraumatic subarachnoid hemorrhage, unspecified: Secondary | ICD-10-CM

## 2022-01-03 DIAGNOSIS — W19XXXA Unspecified fall, initial encounter: Secondary | ICD-10-CM | POA: Insufficient documentation

## 2022-01-03 DIAGNOSIS — T1490XA Injury, unspecified, initial encounter: Secondary | ICD-10-CM | POA: Diagnosis not present

## 2022-01-03 DIAGNOSIS — S52352A Displaced comminuted fracture of shaft of radius, left arm, initial encounter for closed fracture: Secondary | ICD-10-CM | POA: Diagnosis not present

## 2022-01-03 DIAGNOSIS — S0990XA Unspecified injury of head, initial encounter: Secondary | ICD-10-CM | POA: Diagnosis present

## 2022-01-03 LAB — CBC WITH DIFFERENTIAL/PLATELET
Abs Immature Granulocytes: 0.02 10*3/uL (ref 0.00–0.07)
Basophils Absolute: 0.1 10*3/uL (ref 0.0–0.1)
Basophils Relative: 1 %
Eosinophils Absolute: 0.2 10*3/uL (ref 0.0–0.5)
Eosinophils Relative: 3 %
HCT: 36.6 % (ref 36.0–46.0)
Hemoglobin: 12.7 g/dL (ref 12.0–15.0)
Immature Granulocytes: 0 %
Lymphocytes Relative: 20 %
Lymphs Abs: 1.8 10*3/uL (ref 0.7–4.0)
MCH: 29.5 pg (ref 26.0–34.0)
MCHC: 34.7 g/dL (ref 30.0–36.0)
MCV: 84.9 fL (ref 80.0–100.0)
Monocytes Absolute: 0.6 10*3/uL (ref 0.1–1.0)
Monocytes Relative: 7 %
Neutro Abs: 6.1 10*3/uL (ref 1.7–7.7)
Neutrophils Relative %: 69 %
Platelets: 230 10*3/uL (ref 150–400)
RBC: 4.31 MIL/uL (ref 3.87–5.11)
RDW: 12.9 % (ref 11.5–15.5)
WBC: 8.8 10*3/uL (ref 4.0–10.5)
nRBC: 0 % (ref 0.0–0.2)

## 2022-01-03 LAB — BASIC METABOLIC PANEL
Anion gap: 12 (ref 5–15)
BUN: 25 mg/dL — ABNORMAL HIGH (ref 8–23)
CO2: 20 mmol/L — ABNORMAL LOW (ref 22–32)
Calcium: 9.8 mg/dL (ref 8.9–10.3)
Chloride: 105 mmol/L (ref 98–111)
Creatinine, Ser: 0.91 mg/dL (ref 0.44–1.00)
GFR, Estimated: >60 mL/min (ref >60)
Glucose, Bld: 190 mg/dL — ABNORMAL HIGH (ref 70–99)
Potassium: 4.1 mmol/L (ref 3.5–5.1)
Sodium: 137 mmol/L (ref 135–145)

## 2022-01-03 NOTE — ED Triage Notes (Signed)
Fell from her porch down 1 step. States she lost her balance, but denies dizziness or loss of consciousness.  Was assisted up by neighbor.  Suffered facial and wrist injury.

## 2022-01-03 NOTE — ED Provider Notes (Signed)
[name] EMERGENCY DEPT Provider Note   CSN: [id number] Arrival date & time: 01/03/22  1937     History  Chief Complaint  Patient presents with   Lytle Michaels    Brianna [name] is a 81 y.o. female.  Patient here after mechanical fall at home.  Pain in the left wrist, pain over the left side of her face.  She did hit her head.  She is not on blood thinners.  Nothing makes it worse or better.  No neck pain.  No other extremity pain.  No significant medical problems.  The history is provided by the patient.       Home Medications Prior to Admission medications   Medication Sig Start Date End Date Taking? Authorizing Provider  aspirin 81 MG tablet Take 81 mg by mouth daily.    [provider]  atorvastatin (LIPITOR) 20 MG tablet Take 20 mg by mouth daily.    [provider]  calcium-vitamin D (OSCAL WITH D) 500-200 MG-UNIT per tablet Take 1 tablet by mouth.    [provider]  cholecalciferol (VITAMIN D) 1000 UNITS tablet Take 1,000 Units by mouth daily.     [provider]  Cinnamon 500 MG capsule Take 1,000 mg by mouth daily.    [provider]  FIBER PO Take by mouth.    [provider]  fish oil-omega-3 fatty acids 1000 MG capsule Take 1 g by mouth daily.     [provider]  ibuprofen (ADVIL,MOTRIN) 200 MG tablet Take 200-400 mg by mouth every 6 (six) hours as needed for headache.    [provider]  metFORMIN (GLUCOPHAGE) 500 MG tablet Take 500 mg by mouth 2 (two) times daily. 04/09/21   [provider]  oxyCODONE (OXY IR/ROXICODONE) 5 MG immediate release tablet Take 0.5-1 tablets (2.5-5 mg total) by mouth every 6 (six) hours as needed for severe pain. 07/03/21   Stark Klein, MD  propranolol (INDERAL) 40 MG tablet Take 40 mg by mouth 2 (two) times daily. 04/29/21   [provider]      Allergies    Ibandronic acid and Tamiflu [oseltamivir]    Review of Systems   Review  of Systems  Physical Exam Updated Vital Signs BP 131/70   Pulse 65   Temp 97.9 F (36.6 C)   Resp (!) 23   Ht '5\' 3"'$  (1.6 m)   Wt 63 kg   SpO2 96%   BMI 24.62 kg/m  Physical Exam Vitals and nursing note reviewed.  Constitutional:      General: She is not in acute distress.    Appearance: She is well-developed.  HENT:     Head:     Comments: Bruising and swelling of the left side of the cheek. Eyes:     Conjunctiva/sclera: Conjunctivae normal.  Cardiovascular:     Rate and Rhythm: Normal rate and regular rhythm.     Heart sounds: No murmur heard. Pulmonary:     Effort: Pulmonary effort is normal. No respiratory distress.     Breath sounds: Normal breath sounds.  Abdominal:     Palpations: Abdomen is soft.     Tenderness: There is no abdominal tenderness.  Musculoskeletal:        General: Tenderness present. No swelling.     Cervical back: Neck supple.     Comments: Tenderness and some swelling to the left wrist  Skin:    General: Skin is warm and dry.  Capillary Refill: Capillary refill takes less than 2 seconds.  Neurological:     General: No focal deficit present.     Mental Status: She is alert and oriented to person, place, and time.     Cranial Nerves: No cranial nerve deficit.     Sensory: No sensory deficit.     Motor: No weakness.     Coordination: Coordination normal.  Psychiatric:        Mood and Affect: Mood normal.     ED Results / Procedures / Treatments   Labs (all labs ordered are listed, but only abnormal results are displayed) Labs Reviewed  BASIC METABOLIC PANEL - Abnormal; Notable for the following components:      Result Value   CO2 20 (*)    Glucose, Bld 190 (*)    BUN 25 (*)    All other components within normal limits  CBC WITH DIFFERENTIAL/PLATELET    EKG None  Radiology CT Maxillofacial WO CM  Result Date: 01/03/2022 CLINICAL DATA:  Trauma, fall EXAM: CT MAXILLOFACIAL WITHOUT CONTRAST TECHNIQUE: Multidetector CT imaging  of the maxillofacial structures was performed. Multiplanar CT image reconstructions were also generated. RADIATION DOSE REDUCTION: This exam was performed according to the departmental dose-optimization program which includes automated exposure control, adjustment of the mA and/or kV according to patient size and/or use of iterative reconstruction technique. COMPARISON:  None Available. FINDINGS: Osseous: No displaced fractures are seen. Degenerative changes are noted in the left TMJ. Orbits: Optic globes are symmetrical. Retrobulbar soft tissues are unremarkable. There is no evidence of blowout fracture. Sinuses: There are no air-fluid levels or significant mucosal thickening. Soft tissues: There is subcutaneous hematoma in the left periorbital region and anterior to the left maxilla. The hematoma measures approximately 4.7 x 2.6 x 1.5 cm. Limited intracranial: Evaluated in CT brain. IMPRESSION: No recent fracture is seen and facial bones. Optic globes are symmetrical. Retrobulbar soft tissues are unremarkable. There is subcutaneous contusion and hematoma in the left periorbital region and anterior to the left maxilla. Electronically Signed   By: Elmer Picker M.D.   On: 01/03/2022 21:01   CT Cervical Spine Wo Contrast  Result Date: 01/03/2022 CLINICAL DATA:  Trauma, fall EXAM: CT CERVICAL SPINE WITHOUT CONTRAST TECHNIQUE: Multidetector CT imaging of the cervical spine was performed without intravenous contrast. Multiplanar CT image reconstructions were also generated. RADIATION DOSE REDUCTION: This exam was performed according to the departmental dose-optimization program which includes automated exposure control, adjustment of the mA and/or kV according to patient size and/or use of iterative reconstruction technique. COMPARISON:  None Available. FINDINGS: Alignment: Alignment of posterior margin of vertebral bodies is unremarkable. Skull base and vertebrae: No recent fracture is seen. There is 13 mm  smoothly marginated calcification posterior to the spinous process of C5 vertebra suggesting ligament calcification from previous injury. Soft tissues and spinal canal: There is no central spinal stenosis. Disc levels: There is encroachment of neural foramina by bony spurs from C3-C7 levels. Upper chest: Apical regions of both lungs are not included in the images. Other: Scattered arterial calcifications are seen. IMPRESSION: No recent fracture is seen in cervical spine. 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 Electronically Signed   By: Elmer Picker M.D.   On: 01/03/2022 20:56   CT Head Wo Contrast  Result Date: 01/03/2022 CLINICAL DATA:  Trauma, fall EXAM: CT HEAD WITHOUT CONTRAST TECHNIQUE: Contiguous axial images were obtained from the base of the skull through the vertex without intravenous contrast. RADIATION  DOSE REDUCTION: This exam was performed according to the departmental dose-optimization program which includes automated exposure control, adjustment of the mA and/or kV according to patient size and/or use of iterative reconstruction technique. COMPARISON:  None Available. FINDINGS: Brain: There is small focus of subarachnoid hemorrhage in the right parietal lobe. There is no evidence of intraventricular blood. There is no epidural or subdural hematoma. There is no evidence of any large intraparenchymal hematoma. Vascular: Unremarkable. Skull: No fracture is seen. Sinuses/Orbits: There are no air-fluid levels in paranasal sinuses. Optic globes are symmetrical. There is subcutaneous contusion/hematoma in the left periorbital region. Other: None. IMPRESSION: Small amount of acute subarachnoid hemorrhage is seen in the right parietal cortex. There are no epidural or subdural fluid collections. There is no shift of midline structures. Imaging findings were relayed to patient's provider Benedetto Goad by telephone call. Electronically Signed   By: Elmer Picker M.D.   On: 01/03/2022 20:53   DG Wrist Complete Left  Result Date: 01/03/2022 CLINICAL DATA:  Pain after fall EXAM: LEFT WRIST - COMPLETE 3+ VIEW COMPARISON:  None Available. FINDINGS: There is a comminuted minimally displaced fracture through the distal radius extending into the radiocarpal joint. No other abnormalities. IMPRESSION: There is a comminuted minimally displaced fracture through the distal radius extending into the radiocarpal joint. Electronically Signed   By: Dorise Bullion III M.D.   On: 01/03/2022 20:33    Procedures Procedures    Medications Ordered in ED Medications - No data to display  ED Course/ Medical Decision Making/ A&P                           Medical Decision Making Amount and/or Complexity of Data Reviewed Labs: ordered. Radiology: ordered.   Brianna [name] is here after mechanical fall.  Bruising and swelling over the left side of her face, left wrist pain.  No other extremity pain.  Did not lose consciousness.  She is not on blood thinners.  CT scan of her head, face, neck was done prior to my evaluation as well as an x-ray of her left wrist.  Radiology reports that there is a small amount of subarachnoid hemorrhage in the brain.  Talked with Glenford Peers with neurosurgery and recommended a repeat CT scan in 6 hours and if unremarkable can follow-up outpatient.  X-ray of the left wrist showed minimally displaced fracture to the distal radius.  This was splinted.  Talked with Dr. Grandville Silos with orthopedics who will follow up outpatient.  Patient neurovascular neuromuscular intact.  Neurologically she is intact.  Family is comfortable with this plan.  Logistically this makes most sense to have her repeat a head CT in 6 hours and hopefully can discharge her home.  Patient handed off to oncoming ED staff.  Blood work obtained unremarkable per my review and interpretation including CBC and BMP.  EKG showed sinus rhythm.  No ischemic changes per my  review and interpretation.  Please see oncoming ED staff note for further results, evaluation, disposition of the patient.  This chart was dictated using voice recognition software.  Despite best efforts to proofread,  errors can occur which can change the documentation meaning.         Final Clinical Impression(s) / ED Diagnoses Final diagnoses:  Closed fracture of left wrist, initial encounter  SAH (subarachnoid hemorrhage) ([place])    Rx / DC Orders ED Discharge Orders     None  Lennice Sites, DO 01/03/22 2247

## 2022-01-03 NOTE — Progress Notes (Signed)
CT head seen and reviewed which showed a small amount of tsah in the right parietal cortex with no midline shift or mass effect. Reported that she is not on any blood thinners. Would recommend repeat head CT in 6 hours and discharge home with family if the scan is stable.

## 2022-01-04 ENCOUNTER — Emergency Department (HOSPITAL_BASED_OUTPATIENT_CLINIC_OR_DEPARTMENT_OTHER): Payer: PPO

## 2022-01-04 DIAGNOSIS — S0512XA Contusion of eyeball and orbital tissues, left eye, initial encounter: Secondary | ICD-10-CM | POA: Diagnosis not present

## 2022-01-04 MED ORDER — ACETAMINOPHEN 325 MG PO TABS
650.0000 mg | ORAL_TABLET | Freq: Once | ORAL | Status: AC
  Administered 2022-01-04: 650 mg via ORAL
  Filled 2022-01-04: qty 2

## 2022-01-04 NOTE — ED Provider Notes (Signed)
Care of the patient assumed a the change of shift. Here after a fall, had a wrist fracture and a small SAH. Plan is for 6 hour repeat of CT and if unchanged plan discharge home.  Physical Exam  BP 129/60   Pulse 63   Temp 97.9 F (36.6 C)   Resp 12   Ht '5\' 3"'$  (1.6 m)   Wt 63 kg   SpO2 94%   BMI 24.62 kg/m   Physical Exam  Procedures  Procedures  ED Course / MDM   Clinical Course as of 01/04/22 0339  Sun Jan 04, 2022  0337 Repeat CT is unchanged. Patient remains awake and alert. Recommend APAP for pain. Ice and elevation for wrist. Head injury precautions given. RTED for any concerns.  [CS]    Clinical Course User Index [CS] Truddie Hidden, MD   Medical Decision Making Problems Addressed: Closed fracture of left wrist, initial encounter: acute illness or injury SAH (subarachnoid hemorrhage) ([place]): acute illness or injury  Amount and/or Complexity of Data Reviewed Labs: ordered. Radiology: ordered.  Risk OTC drugs. Decision regarding hospitalization.          Truddie Hidden, MD 01/04/22 [phone]

## 2022-01-06 DIAGNOSIS — S52135A Nondisplaced fracture of neck of left radius, initial encounter for closed fracture: Secondary | ICD-10-CM | POA: Diagnosis not present

## 2022-01-08 DIAGNOSIS — I609 Nontraumatic subarachnoid hemorrhage, unspecified: Secondary | ICD-10-CM | POA: Diagnosis not present

## 2022-01-14 DIAGNOSIS — S52552A Other extraarticular fracture of lower end of left radius, initial encounter for closed fracture: Secondary | ICD-10-CM | POA: Diagnosis not present

## 2022-01-14 DIAGNOSIS — S52501A Unspecified fracture of the lower end of right radius, initial encounter for closed fracture: Secondary | ICD-10-CM | POA: Diagnosis not present

## 2022-02-16 DIAGNOSIS — S52135A Nondisplaced fracture of neck of left radius, initial encounter for closed fracture: Secondary | ICD-10-CM | POA: Diagnosis not present

## 2022-02-27 DIAGNOSIS — E1169 Type 2 diabetes mellitus with other specified complication: Secondary | ICD-10-CM | POA: Diagnosis not present

## 2022-02-27 DIAGNOSIS — G250 Essential tremor: Secondary | ICD-10-CM | POA: Diagnosis not present

## 2022-02-27 DIAGNOSIS — Z23 Encounter for immunization: Secondary | ICD-10-CM | POA: Diagnosis not present

## 2022-02-27 DIAGNOSIS — E7800 Pure hypercholesterolemia, unspecified: Secondary | ICD-10-CM | POA: Diagnosis not present

## 2022-03-17 DIAGNOSIS — S52552D Other extraarticular fracture of lower end of left radius, subsequent encounter for closed fracture with routine healing: Secondary | ICD-10-CM | POA: Diagnosis not present

## 2022-03-24 DIAGNOSIS — L812 Freckles: Secondary | ICD-10-CM | POA: Diagnosis not present

## 2022-03-24 DIAGNOSIS — Z85820 Personal history of malignant melanoma of skin: Secondary | ICD-10-CM | POA: Diagnosis not present

## 2022-03-24 DIAGNOSIS — Z85828 Personal history of other malignant neoplasm of skin: Secondary | ICD-10-CM | POA: Diagnosis not present

## 2022-03-24 DIAGNOSIS — L821 Other seborrheic keratosis: Secondary | ICD-10-CM | POA: Diagnosis not present

## 2022-04-07 DIAGNOSIS — Z961 Presence of intraocular lens: Secondary | ICD-10-CM | POA: Diagnosis not present

## 2022-04-07 DIAGNOSIS — H35033 Hypertensive retinopathy, bilateral: Secondary | ICD-10-CM | POA: Diagnosis not present

## 2022-04-07 DIAGNOSIS — H524 Presbyopia: Secondary | ICD-10-CM | POA: Diagnosis not present

## 2022-04-07 DIAGNOSIS — H52223 Regular astigmatism, bilateral: Secondary | ICD-10-CM | POA: Diagnosis not present

## 2022-04-07 DIAGNOSIS — H5203 Hypermetropia, bilateral: Secondary | ICD-10-CM | POA: Diagnosis not present

## 2022-04-07 DIAGNOSIS — E119 Type 2 diabetes mellitus without complications: Secondary | ICD-10-CM | POA: Diagnosis not present

## 2022-06-25 DIAGNOSIS — L821 Other seborrheic keratosis: Secondary | ICD-10-CM | POA: Diagnosis not present

## 2022-06-25 DIAGNOSIS — Z85828 Personal history of other malignant neoplasm of skin: Secondary | ICD-10-CM | POA: Diagnosis not present

## 2022-06-25 DIAGNOSIS — Z85820 Personal history of malignant melanoma of skin: Secondary | ICD-10-CM | POA: Diagnosis not present

## 2022-06-25 DIAGNOSIS — L812 Freckles: Secondary | ICD-10-CM | POA: Diagnosis not present

## 2022-07-24 DIAGNOSIS — Z0000 Encounter for general adult medical examination without abnormal findings: Secondary | ICD-10-CM | POA: Diagnosis not present

## 2022-07-24 DIAGNOSIS — E1169 Type 2 diabetes mellitus with other specified complication: Secondary | ICD-10-CM | POA: Diagnosis not present

## 2022-07-24 DIAGNOSIS — K7581 Nonalcoholic steatohepatitis (NASH): Secondary | ICD-10-CM | POA: Diagnosis not present

## 2022-07-24 DIAGNOSIS — M8580 Other specified disorders of bone density and structure, unspecified site: Secondary | ICD-10-CM | POA: Diagnosis not present

## 2022-07-24 DIAGNOSIS — G250 Essential tremor: Secondary | ICD-10-CM | POA: Diagnosis not present

## 2022-07-24 DIAGNOSIS — E7800 Pure hypercholesterolemia, unspecified: Secondary | ICD-10-CM | POA: Diagnosis not present

## 2022-07-24 DIAGNOSIS — R42 Dizziness and giddiness: Secondary | ICD-10-CM | POA: Diagnosis not present

## 2022-07-24 DIAGNOSIS — J309 Allergic rhinitis, unspecified: Secondary | ICD-10-CM | POA: Diagnosis not present

## 2022-07-24 DIAGNOSIS — E119 Type 2 diabetes mellitus without complications: Secondary | ICD-10-CM | POA: Diagnosis not present

## 2022-07-24 DIAGNOSIS — N8110 Cystocele, unspecified: Secondary | ICD-10-CM | POA: Diagnosis not present

## 2022-07-24 DIAGNOSIS — Z9181 History of falling: Secondary | ICD-10-CM | POA: Diagnosis not present

## 2022-07-24 DIAGNOSIS — C439 Malignant melanoma of skin, unspecified: Secondary | ICD-10-CM | POA: Diagnosis not present

## 2022-07-28 ENCOUNTER — Other Ambulatory Visit: Payer: Self-pay | Admitting: Family Medicine

## 2022-07-28 DIAGNOSIS — M8580 Other specified disorders of bone density and structure, unspecified site: Secondary | ICD-10-CM

## 2022-09-02 ENCOUNTER — Other Ambulatory Visit: Payer: Self-pay | Admitting: Family Medicine

## 2022-09-02 ENCOUNTER — Ambulatory Visit
Admission: RE | Admit: 2022-09-02 | Discharge: 2022-09-02 | Disposition: A | Discharge status: Home / Self Care | Payer: PPO | Source: Ambulatory Visit | Attending: Family Medicine | Admitting: Family Medicine

## 2022-09-02 DIAGNOSIS — R5383 Other fatigue: Secondary | ICD-10-CM

## 2022-09-02 DIAGNOSIS — Z85820 Personal history of malignant melanoma of skin: Secondary | ICD-10-CM | POA: Diagnosis not present

## 2022-09-02 DIAGNOSIS — I700 Atherosclerosis of aorta: Secondary | ICD-10-CM | POA: Diagnosis not present

## 2022-09-02 DIAGNOSIS — Z23 Encounter for immunization: Secondary | ICD-10-CM | POA: Diagnosis not present

## 2022-09-02 DIAGNOSIS — G250 Essential tremor: Secondary | ICD-10-CM | POA: Diagnosis not present

## 2022-09-02 DIAGNOSIS — R42 Dizziness and giddiness: Secondary | ICD-10-CM

## 2022-09-21 DIAGNOSIS — R42 Dizziness and giddiness: Secondary | ICD-10-CM | POA: Diagnosis not present

## 2022-09-23 DIAGNOSIS — R42 Dizziness and giddiness: Secondary | ICD-10-CM | POA: Diagnosis not present

## 2022-11-06 DIAGNOSIS — I471 Supraventricular tachycardia, unspecified: Secondary | ICD-10-CM | POA: Insufficient documentation

## 2022-11-06 DIAGNOSIS — R55 Syncope and collapse: Secondary | ICD-10-CM | POA: Insufficient documentation

## 2022-11-10 NOTE — Progress Notes (Unsigned)
No chief complaint on file.  History of Present Illness: 82 yo Brianna [name] with history of arthritis, melanoma, DM, HLD and sleep apnea who is here today as a new consult, referred by Dr. Clelia Croft, for evaluation of dizziness and near syncope. *** ? SVT  Primary Care Physician: [name], MD   Past Medical History:  Diagnosis Date   Arthritis    neck   Cancer [facility]) 05/2021   Melanoma   Diabetes mellitus without complication (HCC)    Hyperlipidemia    Obesity    Sleep apnea     Past Surgical History:  Procedure Laterality Date   ABDOMINAL HYSTERECTOMY     EXCISION MELANOMA WITH SENTINEL LYMPH NODE BIOPSY Left 07/03/2021   Procedure: WIDE LOCAL EXCISION WITH ADVANCEMENT FLAP CLOSURE LEFT ELBOW MELANOMA WITH SENTINEL LYMPH NODE MAPPING AND BIOPSY;  Surgeon: Almond Lint, MD;  Location: [facility] OR;  Service: General;  Laterality: Left;   TONSILLECTOMY      Current Outpatient Medications  Medication Sig Dispense Refill   aspirin 81 MG tablet Take 81 mg by mouth daily.     atorvastatin (LIPITOR) 20 MG tablet Take 20 mg by mouth daily.     calcium-vitamin D (OSCAL WITH D) 500-200 MG-UNIT per tablet Take 1 tablet by mouth.     cholecalciferol (VITAMIN D) 1000 UNITS tablet Take 1,000 Units by mouth daily.      Cinnamon 500 MG capsule Take 1,000 mg by mouth daily.     FIBER PO Take by mouth.     fish oil-omega-3 fatty acids 1000 MG capsule Take 1 g by mouth daily.      ibuprofen (ADVIL,MOTRIN) 200 MG tablet Take 200-400 mg by mouth every 6 (six) hours as needed for headache.     metFORMIN (GLUCOPHAGE) 500 MG tablet Take 500 mg by mouth 2 (two) times daily.     oxyCODONE (OXY IR/ROXICODONE) 5 MG immediate release tablet Take 0.5-1 tablets (2.5-5 mg total) by mouth every 6 (six) hours as needed for severe pain. 5 tablet 0   propranolol (INDERAL) 40 MG tablet Take 40 mg by mouth 2 (two) times daily.     No current facility-administered medications for this visit.    Allergies  Allergen  Reactions   Ibandronic Acid Other (See Comments)   Tamiflu [Oseltamivir] Other (See Comments)    Social History   Socioeconomic History   Marital status: Married    Spouse name: Not on file   Number of children: Not on file   Years of education: Not on file   Highest education level: Not on file  Occupational History   Not on file  Tobacco Use   Smoking status: Never   Smokeless tobacco: Never  Vaping Use   Vaping Use: Never used  Substance and Sexual Activity   Alcohol use: Not Currently   Drug use: Never   Sexual activity: Not on file  Other Topics Concern   Not on file  Social History Narrative   Not on file   Social Determinants of Health   Financial Resource Strain: Not on file  Food Insecurity: Not on file  Transportation Needs: Not on file  Physical Activity: Not on file  Stress: Not on file  Social Connections: Not on file  Intimate Partner Violence: Not on file    Family History  Problem Relation Age of Onset   Cancer Other    Breast cancer Maternal Aunt     Review of Systems:  As stated in  the HPI and otherwise negative.   There were no vitals taken for this visit.  Physical Examination: General: Well developed, well nourished, NAD  HEENT: OP clear, mucus membranes moist  SKIN: warm, dry. No rashes. Neuro: No focal deficits  Musculoskeletal: Muscle strength 5/5 all ext  Psychiatric: Mood and affect normal  Neck: No JVD, no carotid bruits, no thyromegaly, no lymphadenopathy.  Lungs:Clear bilaterally, no wheezes, rhonci, crackles Cardiovascular: Regular rate and rhythm. No murmurs, gallops or rubs. Abdomen:Soft. Bowel sounds present. Non-tender.  Extremities: No lower extremity edema. Pulses are 2 + in the bilateral DP/PT.  EKG:  EKG {ACTION; IS/IS [id number]} ordered today. The ekg ordered today demonstrates ***  Recent Labs: 01/03/2022: BUN 25; Creatinine, Ser 0.91; Hemoglobin 12.7; Platelets 230; Potassium 4.1; Sodium 137   Lipid  Panel No results found for: "CHOL", "TRIG", "HDL", "CHOLHDL", "VLDL", "LDLCALC", "LDLDIRECT"   Wt Readings from Last 3 Encounters:  01/03/22 63 kg  07/03/21 63.5 kg  06/26/21 63.5 kg      Assessment and Plan:   1.   Labs/ tests ordered today include:  No orders of the defined types were placed in this encounter.    Disposition:   F/U with me in ***    Signed, Verne Carrow, MD, [facility] 11/10/2022 11:57 AM    [facility] [address] [place], [place], [place]  [address] Phone: [phone]; Fax: [phone]

## 2022-11-11 ENCOUNTER — Ambulatory Visit: Payer: PPO | Attending: Cardiovascular Disease | Admitting: Cardiovascular Disease

## 2022-11-11 ENCOUNTER — Encounter: Payer: Self-pay | Admitting: Cardiovascular Disease

## 2022-11-11 VITALS — BP 146/68 | HR 65 | Ht 63.0 in | Wt 132.0 lb

## 2022-11-11 DIAGNOSIS — R42 Dizziness and giddiness: Secondary | ICD-10-CM

## 2022-11-11 DIAGNOSIS — I471 Supraventricular tachycardia, unspecified: Secondary | ICD-10-CM | POA: Diagnosis not present

## 2022-11-11 NOTE — Patient Instructions (Addendum)
Medication Instructions:  No changes *If you need a refill on your cardiac medications before your next appointment, please call your pharmacy*   Lab Work: none   Testing/Procedures: Your physician has requested that you have an echocardiogram. Echocardiography is a painless test that uses sound waves to create images of your heart. It provides your doctor with information about the size and shape of your heart and how well your heart's chambers and valves are working. This procedure takes approximately one hour. There are no restrictions for this procedure. Please do NOT wear cologne, perfume, aftershave, or lotions (deodorant is allowed). Please arrive 15 minutes prior to your appointment time.   Follow-Up: At [facility], you and your health needs are our priority.  As part of our continuing mission to provide you with exceptional heart care, we have created designated Provider Care Teams.  These Care Teams include your primary Cardiologist (physician) and Advanced Practice Providers (APPs -  Physician Assistants and Nurse Practitioners) who all work together to provide you with the care you need, when you need it.   Your next appointment:   12 year(s)  Provider:   Verne Carrow, MD

## 2022-12-17 ENCOUNTER — Ambulatory Visit (HOSPITAL_COMMUNITY): Payer: PPO | Attending: Cardiovascular Disease

## 2022-12-17 DIAGNOSIS — R55 Syncope and collapse: Secondary | ICD-10-CM | POA: Diagnosis not present

## 2022-12-17 DIAGNOSIS — R42 Dizziness and giddiness: Secondary | ICD-10-CM | POA: Diagnosis not present

## 2022-12-17 DIAGNOSIS — I119 Hypertensive heart disease without heart failure: Secondary | ICD-10-CM | POA: Diagnosis not present

## 2022-12-17 DIAGNOSIS — E119 Type 2 diabetes mellitus without complications: Secondary | ICD-10-CM | POA: Insufficient documentation

## 2022-12-17 DIAGNOSIS — I471 Supraventricular tachycardia, unspecified: Secondary | ICD-10-CM | POA: Diagnosis not present

## 2022-12-17 LAB — ECHOCARDIOGRAM COMPLETE
Area-P 1/2: 2.4 cm2
S' Lateral: 2.3 cm

## 2022-12-22 ENCOUNTER — Telehealth: Payer: Self-pay | Admitting: Cardiovascular Disease

## 2022-12-22 NOTE — Telephone Encounter (Signed)
I called and spoke w the patient, reviewed echo results.

## 2022-12-22 NOTE — Telephone Encounter (Signed)
Patient is returning call in regards to results. Transferred to [place], Charity fundraiser.

## 2022-12-28 DIAGNOSIS — Z85828 Personal history of other malignant neoplasm of skin: Secondary | ICD-10-CM | POA: Diagnosis not present

## 2022-12-28 DIAGNOSIS — Z85820 Personal history of malignant melanoma of skin: Secondary | ICD-10-CM | POA: Diagnosis not present

## 2022-12-28 DIAGNOSIS — L812 Freckles: Secondary | ICD-10-CM | POA: Diagnosis not present

## 2022-12-28 DIAGNOSIS — D225 Melanocytic nevi of trunk: Secondary | ICD-10-CM | POA: Diagnosis not present

## 2022-12-28 DIAGNOSIS — L821 Other seborrheic keratosis: Secondary | ICD-10-CM | POA: Diagnosis not present

## 2023-01-22 DIAGNOSIS — E7800 Pure hypercholesterolemia, unspecified: Secondary | ICD-10-CM | POA: Diagnosis not present

## 2023-01-22 DIAGNOSIS — M8580 Other specified disorders of bone density and structure, unspecified site: Secondary | ICD-10-CM | POA: Diagnosis not present

## 2023-01-22 DIAGNOSIS — E1169 Type 2 diabetes mellitus with other specified complication: Secondary | ICD-10-CM | POA: Diagnosis not present

## 2023-01-22 DIAGNOSIS — I471 Supraventricular tachycardia, unspecified: Secondary | ICD-10-CM | POA: Diagnosis not present

## 2023-01-22 DIAGNOSIS — E119 Type 2 diabetes mellitus without complications: Secondary | ICD-10-CM | POA: Diagnosis not present

## 2023-03-10 ENCOUNTER — Other Ambulatory Visit: Payer: Self-pay | Admitting: Family Medicine

## 2023-03-10 DIAGNOSIS — M8580 Other specified disorders of bone density and structure, unspecified site: Secondary | ICD-10-CM

## 2023-03-10 DIAGNOSIS — Z23 Encounter for immunization: Secondary | ICD-10-CM | POA: Diagnosis not present

## 2023-07-28 ENCOUNTER — Other Ambulatory Visit: Payer: Self-pay | Admitting: Family Medicine

## 2023-07-28 DIAGNOSIS — Z1231 Encounter for screening mammogram for malignant neoplasm of breast: Secondary | ICD-10-CM

## 2023-07-28 LAB — LAB REPORT - SCANNED
A1c: 7.7
Creatinine, POC: 126 mg/dL
EGFR: 78
Microalbumin, Urine: 2.17

## 2023-09-09 DIAGNOSIS — N8110 Cystocele, unspecified: Secondary | ICD-10-CM | POA: Diagnosis not present

## 2023-09-09 DIAGNOSIS — N3941 Urge incontinence: Secondary | ICD-10-CM | POA: Diagnosis not present

## 2023-09-09 DIAGNOSIS — N3281 Overactive bladder: Secondary | ICD-10-CM | POA: Diagnosis not present

## 2023-09-21 DIAGNOSIS — E119 Type 2 diabetes mellitus without complications: Secondary | ICD-10-CM | POA: Diagnosis not present

## 2023-09-21 DIAGNOSIS — H53143 Visual discomfort, bilateral: Secondary | ICD-10-CM | POA: Diagnosis not present

## 2023-09-22 ENCOUNTER — Ambulatory Visit
Admission: RE | Admit: 2023-09-22 | Discharge: 2023-09-22 | Disposition: A | Discharge status: Home / Self Care | Payer: PPO | Source: Ambulatory Visit | Attending: Family Medicine | Admitting: Family Medicine

## 2023-09-22 ENCOUNTER — Other Ambulatory Visit: Payer: Self-pay | Admitting: Family Medicine

## 2023-09-22 DIAGNOSIS — Z1231 Encounter for screening mammogram for malignant neoplasm of breast: Secondary | ICD-10-CM

## 2023-09-22 DIAGNOSIS — M8588 Other specified disorders of bone density and structure, other site: Secondary | ICD-10-CM | POA: Diagnosis not present

## 2023-09-22 DIAGNOSIS — N958 Other specified menopausal and perimenopausal disorders: Secondary | ICD-10-CM | POA: Diagnosis not present

## 2023-09-22 DIAGNOSIS — E2839 Other primary ovarian failure: Secondary | ICD-10-CM | POA: Diagnosis not present

## 2023-09-22 DIAGNOSIS — M8580 Other specified disorders of bone density and structure, unspecified site: Secondary | ICD-10-CM

## 2023-09-30 ENCOUNTER — Ambulatory Visit
Admission: RE | Admit: 2023-09-30 | Discharge: 2023-09-30 | Disposition: A | Discharge status: Home / Self Care | Source: Ambulatory Visit | Attending: Family Medicine | Admitting: Family Medicine

## 2023-09-30 DIAGNOSIS — Z1231 Encounter for screening mammogram for malignant neoplasm of breast: Secondary | ICD-10-CM | POA: Diagnosis not present

## 2023-12-29 DIAGNOSIS — Z85820 Personal history of malignant melanoma of skin: Secondary | ICD-10-CM | POA: Diagnosis not present

## 2023-12-29 DIAGNOSIS — D2272 Melanocytic nevi of left lower limb, including hip: Secondary | ICD-10-CM | POA: Diagnosis not present

## 2023-12-29 DIAGNOSIS — L821 Other seborrheic keratosis: Secondary | ICD-10-CM | POA: Diagnosis not present

## 2023-12-29 DIAGNOSIS — Z85828 Personal history of other malignant neoplasm of skin: Secondary | ICD-10-CM | POA: Diagnosis not present

## 2023-12-29 DIAGNOSIS — L812 Freckles: Secondary | ICD-10-CM | POA: Diagnosis not present

## 2023-12-29 DIAGNOSIS — D2271 Melanocytic nevi of right lower limb, including hip: Secondary | ICD-10-CM | POA: Diagnosis not present

## 2024-01-24 DIAGNOSIS — G250 Essential tremor: Secondary | ICD-10-CM | POA: Diagnosis not present

## 2024-01-24 DIAGNOSIS — E1169 Type 2 diabetes mellitus with other specified complication: Secondary | ICD-10-CM | POA: Diagnosis not present

## 2024-01-24 DIAGNOSIS — E7800 Pure hypercholesterolemia, unspecified: Secondary | ICD-10-CM | POA: Diagnosis not present

## 2024-01-24 DIAGNOSIS — M8580 Other specified disorders of bone density and structure, unspecified site: Secondary | ICD-10-CM | POA: Diagnosis not present

## 2024-03-04 ENCOUNTER — Emergency Department (HOSPITAL_BASED_OUTPATIENT_CLINIC_OR_DEPARTMENT_OTHER)
Admission: EM | Admit: 2024-03-04 | Discharge: 2024-03-04 | Disposition: A | Discharge status: Home / Self Care | Attending: Emergency Medicine | Admitting: Emergency Medicine

## 2024-03-04 ENCOUNTER — Emergency Department (HOSPITAL_BASED_OUTPATIENT_CLINIC_OR_DEPARTMENT_OTHER): Admitting: Radiology

## 2024-03-04 ENCOUNTER — Encounter (HOSPITAL_BASED_OUTPATIENT_CLINIC_OR_DEPARTMENT_OTHER): Payer: Self-pay | Admitting: Emergency Medicine

## 2024-03-04 ENCOUNTER — Emergency Department (HOSPITAL_BASED_OUTPATIENT_CLINIC_OR_DEPARTMENT_OTHER)

## 2024-03-04 DIAGNOSIS — R6883 Chills (without fever): Secondary | ICD-10-CM | POA: Diagnosis not present

## 2024-03-04 DIAGNOSIS — R0989 Other specified symptoms and signs involving the circulatory and respiratory systems: Secondary | ICD-10-CM | POA: Diagnosis not present

## 2024-03-04 DIAGNOSIS — Z7984 Long term (current) use of oral hypoglycemic drugs: Secondary | ICD-10-CM | POA: Diagnosis not present

## 2024-03-04 DIAGNOSIS — Z7982 Long term (current) use of aspirin: Secondary | ICD-10-CM | POA: Diagnosis not present

## 2024-03-04 DIAGNOSIS — E119 Type 2 diabetes mellitus without complications: Secondary | ICD-10-CM | POA: Diagnosis not present

## 2024-03-04 DIAGNOSIS — U071 COVID-19: Secondary | ICD-10-CM | POA: Diagnosis not present

## 2024-03-04 DIAGNOSIS — R410 Disorientation, unspecified: Secondary | ICD-10-CM | POA: Diagnosis not present

## 2024-03-04 DIAGNOSIS — I10 Essential (primary) hypertension: Secondary | ICD-10-CM | POA: Diagnosis not present

## 2024-03-04 DIAGNOSIS — Z79899 Other long term (current) drug therapy: Secondary | ICD-10-CM | POA: Diagnosis not present

## 2024-03-04 DIAGNOSIS — R4182 Altered mental status, unspecified: Secondary | ICD-10-CM | POA: Diagnosis not present

## 2024-03-04 LAB — URINALYSIS, ROUTINE W REFLEX MICROSCOPIC
Bilirubin Urine: NEGATIVE
Glucose, UA: >1000 mg/dL — AB
Hgb urine dipstick: NEGATIVE
Ketones, ur: 40 mg/dL — AB
Nitrite: NEGATIVE
Protein, ur: 30 mg/dL — AB
Specific Gravity, Urine: 1.031 — ABNORMAL HIGH (ref 1.005–1.030)
pH: 5.5 (ref 5.0–8.0)

## 2024-03-04 LAB — CBC
HCT: 39.5 % (ref 36.0–46.0)
Hemoglobin: 13.6 g/dL (ref 12.0–15.0)
MCH: 29.3 pg (ref 26.0–34.0)
MCHC: 34.4 g/dL (ref 30.0–36.0)
MCV: 85.1 fL (ref 80.0–100.0)
Platelets: 203 K/uL (ref 150–400)
RBC: 4.64 MIL/uL (ref 3.87–5.11)
RDW: 13.2 % (ref 11.5–15.5)
WBC: 8.2 K/uL (ref 4.0–10.5)
nRBC: 0 % (ref 0.0–0.2)

## 2024-03-04 LAB — RESP PANEL BY RT-PCR (RSV, FLU A&B, COVID)  RVPGX2
Influenza A by PCR: NEGATIVE
Influenza B by PCR: NEGATIVE
Resp Syncytial Virus by PCR: NEGATIVE
SARS Coronavirus 2 by RT PCR: POSITIVE — AB

## 2024-03-04 LAB — COMPREHENSIVE METABOLIC PANEL WITH GFR
ALT: 14 U/L (ref 0–44)
AST: 23 U/L (ref 15–41)
Albumin: 4.7 g/dL (ref 3.5–5.0)
Alkaline Phosphatase: 84 U/L (ref 38–126)
Anion gap: 17 — ABNORMAL HIGH (ref 5–15)
BUN: 12 mg/dL (ref 8–23)
CO2: 20 mmol/L — ABNORMAL LOW (ref 22–32)
Calcium: 10.2 mg/dL (ref 8.9–10.3)
Chloride: 100 mmol/L (ref 98–111)
Creatinine, Ser: 0.89 mg/dL (ref 0.44–1.00)
GFR, Estimated: >60 mL/min (ref >60)
Glucose, Bld: 215 mg/dL — ABNORMAL HIGH (ref 70–99)
Potassium: 3.8 mmol/L (ref 3.5–5.1)
Sodium: 136 mmol/L (ref 135–145)
Total Bilirubin: 0.9 mg/dL (ref 0.0–1.2)
Total Protein: 7.8 g/dL (ref 6.5–8.1)

## 2024-03-04 LAB — CBG MONITORING, ED: Glucose-Capillary: 183 mg/dL — ABNORMAL HIGH (ref 70–99)

## 2024-03-04 MED ORDER — PAXLOVID (300/100) 20 X 150 MG & 10 X 100MG PO TBPK: 3.0000 | ORAL_TABLET | Freq: Two times a day (BID) | ORAL | 0 refills | Status: AC

## 2024-03-04 MED ORDER — PAXLOVID (300/100) 20 X 150 MG & 10 X 100MG PO TBPK: 3.0000 | ORAL_TABLET | Freq: Two times a day (BID) | ORAL | 0 refills | Status: DC

## 2024-03-04 MED ORDER — PAXLOVID (300/100) 20 X 150 MG & 10 X 100MG PO TBPK
3.0000 | ORAL_TABLET | Freq: Two times a day (BID) | ORAL | 0 refills | Status: DC
Start: 2024-03-04 — End: 2024-03-04

## 2024-03-04 NOTE — ED Provider Notes (Signed)
 [place] EMERGENCY DEPARTMENT AT [facility] Provider Note   CSN: [id number] Arrival date & time: 03/04/24  1951     Patient presents with: Altered Mental Status   Brianna [name] is a 83 y.o. female.   83 year old female with a history of diabetes and hypertension who presents to the emergency department with altered mental status.  History obtained per the patient and her wife.  She recently returned from a trip to [facility].  Last night the patient's daughter was with her and reports she was normal.  This morning a family member was talking to her on the phone and stated that she seemed off and has not quite been behaving herself.  The patient reports that she has had chills today and congestion.  Denies any nausea or vomiting.  No headache.  No dysuria or frequency.  No known sick contacts.  No new medication changes but did take some Zyrtec for her congestion.  No alcohol or substance use.       Prior to Admission medications   Medication Sig Start Date End Date Taking? Authorizing Provider  aspirin 81 MG tablet Take 81 mg by mouth daily.    [provider]  atorvastatin (LIPITOR) 20 MG tablet Take 20 mg by mouth daily.    [provider]  calcium-vitamin D (OSCAL WITH D) 500-200 MG-UNIT per tablet Take 1 tablet by mouth.    [provider]  cholecalciferol (VITAMIN D) 1000 UNITS tablet Take 1,000 Units by mouth daily.     [provider]  Cinnamon 500 MG capsule Take 1,000 mg by mouth daily.    [provider]  FIBER PO Take by mouth.    [provider]  fish oil-omega-3 fatty acids 1000 MG capsule Take 1 g by mouth daily.     [provider]  ibuprofen (ADVIL,MOTRIN) 200 MG tablet Take 200-400 mg by mouth every 6 (six) hours as needed for headache.    [provider]  metFORMIN (GLUCOPHAGE) 500 MG tablet Take 500 mg by mouth 2 (two) times daily. 04/09/21   [provider]   nirmatrelvir/ritonavir (PAXLOVID , 300/100,) 20 x 150 MG & 10 x 100MG  TBPK Take 3 tablets by mouth 2 (two) times daily for 5 days. Patient GFR is >60. Take nirmatrelvir (150 mg) two tablets twice daily for 5 days and ritonavir (100 mg) one tablet twice daily for 5 days. 03/04/24 03/09/24  Yolande Lamar BROCKS, MD  oxyCODONE  (OXY IR/ROXICODONE ) 5 MG immediate release tablet Take 0.5-1 tablets (2.5-5 mg total) by mouth every 6 (six) hours as needed for severe pain. 07/03/21   Aron Shoulders, MD  propranolol (INDERAL) 40 MG tablet Take 40 mg by mouth 2 (two) times daily. 04/29/21   [provider]    Allergies: Ibandronate and Tamiflu [oseltamivir]    Review of Systems  Updated Vital Signs BP 139/70   Pulse 88   Temp 98.8 F (37.1 C) (Oral)   Resp 19   SpO2 93%   Physical Exam Vitals and nursing note reviewed.  Constitutional:      General: She is not in acute distress.    Appearance: She is well-developed.  HENT:     Head: Normocephalic and atraumatic.     Right Ear: External ear normal.     Left Ear: External ear normal.     Nose: Nose normal.     Mouth/Throat:     Mouth: Mucous membranes are moist.     Pharynx: Posterior  oropharyngeal erythema present. No oropharyngeal exudate.  Eyes:     Extraocular Movements: Extraocular movements intact.     Conjunctiva/sclera: Conjunctivae normal.     Pupils: Pupils are equal, round, and reactive to light.  Cardiovascular:     Rate and Rhythm: Normal rate and regular rhythm.     Heart sounds: No murmur heard. Pulmonary:     Effort: Pulmonary effort is normal. No respiratory distress.     Breath sounds: Normal breath sounds.  Abdominal:     General: Abdomen is flat. There is no distension.     Palpations: Abdomen is soft. There is no mass.     Tenderness: There is no abdominal tenderness. There is no guarding.  Musculoskeletal:     Cervical back: Normal range of motion and neck supple.     Right lower leg: No edema.     Left lower  leg: No edema.  Skin:    General: Skin is warm and dry.  Neurological:     Mental Status: She is alert and oriented to person, place, and time. Mental status is at baseline.     Comments: Best Gaze: Horizontal ocular movements intact  Visual Fields: No visual field loss  Facial Palsy: None  5/5 strength in bilateral upper extremities 5/5 strength in bilateral lower extremities Ataxia: Absent  Sensory: Intact sensation to light touch on face, arms, trunk, and legs bilaterally  Best Language: No aphasia  Dysarthria: No dysarthria  Neglect: No visual or sensory neglect     Psychiatric:        Mood and Affect: Mood normal.     (all labs ordered are listed, but only abnormal results are displayed) Labs Reviewed  RESP PANEL BY RT-PCR (RSV, FLU A&B, COVID)  RVPGX2 - Abnormal; Notable for the following components:      Result Value   SARS Coronavirus 2 by RT PCR POSITIVE (*)    All other components within normal limits  COMPREHENSIVE METABOLIC PANEL WITH GFR - Abnormal; Notable for the following components:   CO2 20 (*)    Glucose, Bld 215 (*)    Anion gap 17 (*)    All other components within normal limits  URINALYSIS, ROUTINE W REFLEX MICROSCOPIC - Abnormal; Notable for the following components:   Specific Gravity, Urine 1.031 (*)    Glucose, UA >1,000 (*)    Ketones, ur 40 (*)    Protein, ur 30 (*)    Leukocytes,Ua SMALL (*)    Bacteria, UA RARE (*)    All other components within normal limits  CBG MONITORING, ED - Abnormal; Notable for the following components:   Glucose-Capillary 183 (*)    All other components within normal limits  CBC  CBG MONITORING, ED    EKG: EKG Interpretation Date/Time:  Saturday March 04 2024 20:08:15 EDT Ventricular Rate:  93 PR Interval:  171 QRS Duration:  84 QT Interval:  355 QTC Calculation: 442 R Axis:   75  Text Interpretation: Sinus rhythm Nonspecific repol abnormality, diffuse leads Confirmed by Yolande Charleston [phone]) on  03/04/2024 8:14:59 PM  Radiology: [facility] Chest 2 View Result Date: 03/04/2024 EXAM: 2 VIEW(S) XRAY OF THE CHEST 03/04/2024 09:18:00 PM COMPARISON: None available. CLINICAL HISTORY: Chills, congestion. Confused today. Just off. Last normal yesterday per family. Talk to her today around 2pm and noted to be off. Family concerned-per triage RN note. FINDINGS: LUNGS AND PLEURA: No focal pulmonary opacity. No pulmonary edema. No pleural effusion. No pneumothorax. HEART AND MEDIASTINUM: No acute  abnormality of the cardiac and mediastinal silhouettes. BONES AND SOFT TISSUES: No acute osseous abnormality. Surgical clips are seen within the left axilla. IMPRESSION: 1. No acute process. Electronically signed by: Dorethia Molt MD 03/04/2024 09:24 PM EDT RP Workstation: [id number]   CT Head Wo Contrast Result Date: 03/04/2024 EXAM: CT HEAD WITHOUT CONTRAST 03/04/2024 09:13:00 PM TECHNIQUE: CT of the head was performed without the administration of intravenous contrast. Automated exposure control, iterative reconstruction, and/or weight based adjustment of the mA/kV was utilized to reduce the radiation dose to as low as reasonably achievable. COMPARISON: None available. CLINICAL HISTORY: AMS. Confused today. Just off. Last normal yesterday per family. Talk to her today around 2pm and noted to be off. Family concerned-per triage RN note. FINDINGS: BRAIN AND VENTRICLES: Parenchymal volume loss is commensurate with the patient's age. Periventricular white matter changes are present likely reflecting the sequela of small vessel ischemia. No acute hemorrhage. No evidence of acute infarct. No hydrocephalus. No extra-axial collection. No mass effect or midline shift. ORBITS: No acute abnormality. SINUSES: No acute abnormality. SOFT TISSUES AND SKULL: No acute soft tissue abnormality. No skull fracture. IMPRESSION: 1. No acute intracranial abnormality. 2. Parenchymal volume loss commensurate with the patient's age. 3.  Periventricular white matter changes likely reflecting the sequela of small vessel ischemia. Electronically signed by: Dorethia Molt MD 03/04/2024 09:20 PM EDT RP Workstation: [id number]     Procedures   Medications Ordered in the ED - No data to display                                  Medical Decision Making Amount and/or Complexity of Data Reviewed Labs: ordered. Radiology: ordered.  Risk Prescription drug management.   Brianna [name] is a 83 year old female with a history of hypertension and diabetes who presents emergency department with confusion, congestion, and weakness  Initial Ddx:  URI, pneumonia, stroke, electrolyte abnormality, medication side effect  MDM/Course:  Patient presents emergency department follow mental status.  Patient's family reports that she has been confused recently.  Is having some URI symptoms.  On exam no neurologic deficits.  She is not in acute distress.  Lungs are clear to auscultation bilaterally and she satting well on room air.  Chest x-ray without pneumonia.  COVID and flu showed that she did have COVID.  As part of her workup also had a urinalysis that does not show evidence of UTI.  She had a CT head that did not show acute abnormalities.  Upon re-evaluation remained comfortable on room air and satting well.  Will start her on Paxlovid  at this point in time since her symptoms started today and have her follow-up with her primary doctor.  This patient presents to the ED for concern of complaints listed in HPI, this involves an extensive number of treatment options, and is a complaint that carries with it a high risk of complications and morbidity. Disposition including potential need for admission considered.   Dispo: DC Home. Return precautions discussed including, but not limited to, those listed in the AVS. Allowed pt time to ask questions which were answered fully prior to dc.  Additional history obtained from daughter Records  reviewed Outpatient Clinic Notes The following labs were independently interpreted: Chemistry and show no acute abnormality I independently reviewed the following imaging with scope of interpretation limited to determining acute life threatening conditions related to emergency care: Chest x-ray and agree with the radiologist interpretation  with the following exceptions: none I personally reviewed and interpreted cardiac monitoring: normal sinus rhythm  I personally reviewed and interpreted the pt's EKG: see above for interpretation  I have reviewed the patients home medications and made adjustments as needed Social Determinants of health:  Geriatric  Portions of this note were generated with Scientist, clinical (histocompatibility and immunogenetics). Dictation errors may occur despite best attempts at proofreading.     Final diagnoses:  COVID-19    ED Discharge Orders          Ordered    nirmatrelvir/ritonavir (PAXLOVID , 300/100,) 20 x 150 MG & 10 x 100MG  TBPK  2 times daily,   Status:  Discontinued        03/04/24 2227    nirmatrelvir/ritonavir (PAXLOVID , 300/100,) 20 x 150 MG & 10 x 100MG  TBPK  2 times daily,   Status:  Discontinued        03/04/24 2228    nirmatrelvir/ritonavir (PAXLOVID , 300/100,) 20 x 150 MG & 10 x 100MG  TBPK  2 times daily        03/04/24 2228               Yolande Lamar BROCKS, MD 03/04/24 2340

## 2024-03-04 NOTE — ED Triage Notes (Signed)
 Confused today.  Just off Last normal yesterday per family Talk to her today around 2pm and noted to be off Family concerned

## 2024-03-04 NOTE — Discharge Instructions (Addendum)
 You were seen for your COVID infection in the emergency department.   At home, please use Tylenol  for your muscle aches and fevers.  Please use over-the-counter cough medication or tea with honey for your cough.  Take the paxlovid  for your COVID infection. Stop your atorvastatin while on this medication. You can resume it afterwards.   Follow-up with your primary doctor in 2-3 days regarding your visit.  This may be over the phone.  Return immediately to the emergency department if you experience any of the following: Difficulty breathing, or any other concerning symptoms.    Thank you for visiting our Emergency Department. It was a pleasure taking care of you today.

## 2024-03-04 NOTE — ED Notes (Signed)
 RN reviewed discharge instructions with pt. Pt verbalized understanding and had no further questions. VSS upon discharge.
